# Patient Record
Sex: Male | Born: 1980 | Race: White | Hispanic: No | Marital: Married | Smoking: Never smoker
Health system: Southern US, Community
[De-identification: ages and names within clinical notes are randomized; demographics above are authoritative.]

## PROBLEM LIST (undated history)

## (undated) DIAGNOSIS — E7800 Pure hypercholesterolemia, unspecified: Secondary | ICD-10-CM

## (undated) DIAGNOSIS — M26629 Arthralgia of temporomandibular joint, unspecified side: Secondary | ICD-10-CM

## (undated) DIAGNOSIS — J9819 Other pulmonary collapse: Secondary | ICD-10-CM

## (undated) DIAGNOSIS — R030 Elevated blood-pressure reading, without diagnosis of hypertension: Secondary | ICD-10-CM

## (undated) DIAGNOSIS — T781XXA Other adverse food reactions, not elsewhere classified, initial encounter: Secondary | ICD-10-CM

## (undated) DIAGNOSIS — J4540 Moderate persistent asthma, uncomplicated: Secondary | ICD-10-CM

## (undated) DIAGNOSIS — F419 Anxiety disorder, unspecified: Secondary | ICD-10-CM

## (undated) DIAGNOSIS — G4700 Insomnia, unspecified: Secondary | ICD-10-CM

## (undated) DIAGNOSIS — R7611 Nonspecific reaction to tuberculin skin test without active tuberculosis: Secondary | ICD-10-CM

## (undated) DIAGNOSIS — J341 Cyst and mucocele of nose and nasal sinus: Secondary | ICD-10-CM

## (undated) HISTORY — DX: Pure hypercholesterolemia, unspecified: E78.00

## (undated) HISTORY — DX: Elevated blood-pressure reading, without diagnosis of hypertension: R03.0

## (undated) HISTORY — DX: Moderate persistent asthma, uncomplicated: J45.40

## (undated) HISTORY — DX: Insomnia, unspecified: G47.00

## (undated) HISTORY — DX: Cyst and mucocele of nose and nasal sinus: J34.1

## (undated) HISTORY — DX: Other adverse food reactions, not elsewhere classified, initial encounter: T78.1XXA

## (undated) HISTORY — PX: OTHER SURGICAL HISTORY: SHX169

## (undated) HISTORY — DX: Other pulmonary collapse: J98.19

## (undated) HISTORY — DX: Anxiety disorder, unspecified: F41.9

## (undated) HISTORY — DX: Nonspecific reaction to tuberculin skin test without active tuberculosis: R76.11

## (undated) HISTORY — DX: Arthralgia of temporomandibular joint, unspecified side: M26.629

---

## 2010-10-21 ENCOUNTER — Ambulatory Visit (INDEPENDENT_AMBULATORY_CARE_PROVIDER_SITE_OTHER): Payer: BC Managed Care – PPO | Admitting: Family Medicine

## 2010-10-21 ENCOUNTER — Encounter: Payer: Self-pay | Admitting: Family Medicine

## 2010-10-21 DIAGNOSIS — F411 Generalized anxiety disorder: Secondary | ICD-10-CM

## 2010-10-21 DIAGNOSIS — J309 Allergic rhinitis, unspecified: Secondary | ICD-10-CM | POA: Insufficient documentation

## 2010-10-21 DIAGNOSIS — G4700 Insomnia, unspecified: Secondary | ICD-10-CM

## 2010-10-21 DIAGNOSIS — Z23 Encounter for immunization: Secondary | ICD-10-CM

## 2010-10-21 DIAGNOSIS — IMO0002 Reserved for concepts with insufficient information to code with codable children: Secondary | ICD-10-CM | POA: Insufficient documentation

## 2010-10-21 DIAGNOSIS — F419 Anxiety disorder, unspecified: Secondary | ICD-10-CM | POA: Insufficient documentation

## 2010-10-21 DIAGNOSIS — J45909 Unspecified asthma, uncomplicated: Secondary | ICD-10-CM

## 2010-10-21 HISTORY — DX: Insomnia, unspecified: G47.00

## 2010-10-21 MED ORDER — ALBUTEROL SULFATE HFA 108 (90 BASE) MCG/ACT IN AERS: 2.0000 | INHALATION_SPRAY | Freq: Four times a day (QID) | RESPIRATORY_TRACT | Status: DC | PRN

## 2010-10-21 MED ORDER — BUDESONIDE 180 MCG/ACT IN AEPB: 1.0000 | INHALATION_SPRAY | Freq: Two times a day (BID) | RESPIRATORY_TRACT | Status: DC

## 2010-10-21 MED ORDER — ACYCLOVIR 400 MG PO TABS: ORAL_TABLET | ORAL | Status: DC

## 2010-10-21 MED ORDER — FEXOFENADINE HCL 180 MG PO TABS: 180.0000 mg | ORAL_TABLET | Freq: Every day | ORAL | Status: DC

## 2010-10-21 MED ORDER — CITALOPRAM HYDROBROMIDE 20 MG PO TABS: 20.0000 mg | ORAL_TABLET | Freq: Every day | ORAL | Status: DC

## 2010-10-21 NOTE — Assessment & Plan Note (Signed)
Continue Allegra 180 mg qd 

## 2010-10-21 NOTE — Assessment & Plan Note (Signed)
Poor control.  I do not think he is having an acute flare at this time, but he'll call if he is requiring alb rescue 2-3 times per day in the next week (he has been taking it 3 times per day SCHEDULED for the last 3-4 d at the instruction of his dad (pulmonologist) until he could be seen by a local MD). Will start pulmicort flexhaler 180 mcg, 2 puffs bid.  Continue albut (ProAir) 1-2 puffs q4h PRN.

## 2010-10-21 NOTE — Assessment & Plan Note (Signed)
Start citalopram 20mg  qd in the evening.  Therapeutic expectations and side effect profile of medication discussed today.  Patient's questions answered. F/u 3 wks.

## 2010-10-21 NOTE — Progress Notes (Signed)
Office Note 10/21/2010  CC:  Chief Complaint  Patient presents with  . Asthma    HPI:  Matthew Petty is a 30 y.o. latino male who is here to establish care and discuss asthma, anxiety, and insomnia. Patient's most recent primary MD: Gala Lewandowsky of [facility].  Old records were not reviewed prior to or during today's visit.  1) hx of asthma, no controller therapy in the last few years, had onset of increased nasal allergies/sneezing about 4 mo/ago, also wheezing/sob requiring albuterol 2-3 times per week, more often hs.  The past week he has required it more often: several times per day and it helps.  No fevers, no productive cough, no chest pain, no palpitations, no HA, no ST, no ear pain, no dizziness.  His dad is a pulmonologist in [place] and he told him to get an MD locally and be seen for worsening asthma lately.   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  Has new infant son.  Frustrated with no time to work on what he wants to work on.  Says an MD in [place] tried wellbutrin but no help w/45mo of use. +sleep difficulty, sounds like it has been last few years, worse the last few months.  Tried sleep hygiene but these type of behavioral mod therapies INDUCE more anxiety in him per his report today.  Doesn't nap in daytime.  Goes to sleep same time nightly, up same time in am.  Feels exhausted some days, some days fine.  Ambien trials in the past caused too much of a drugged feeling/hangover effect.   Past Medical History  Diagnosis Date  . Asthma   . Allergic rhinitis     ragweed  . Herpes simplex     genital, recurrent  . Anxiety     Failed trial of wellbutrin in the past  . 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 Elevated blood pressure reading without diagnosis of hypertension     History reviewed. No  pertinent past surgical history.  Family History  Problem Relation Age of Onset  . Cancer Mother     Thyroid: cured (age 40)  . Cancer Paternal Grandfather     Prostate cancer in his 39s  . Heart disease Maternal Grandfather   . Mental illness Maternal Grandfather     bipolar?    History   Social History  . Marital Status: Married    Spouse Name: N/A    Number of Children: N/A  . Years of Education: N/A   Occupational History  . Not on file.   Social History Main Topics  . Smoking status: Never Smoker   . Smokeless tobacco: Never Used  . Alcohol Use: Yes     weekend  . Drug Use: No  . Sexually Active: Not on file   Other Topics Concern  . Not on file   Social History Narrative   Married, has one 58 month old son.Moved to [place] from [place] around 2006.Relocated to [place] 2010, teaches spanish at [employer] postgrad degree in business/finance.No exercise.No tobacco or drugs.  Drinks some alcohol on weekends/socially.   MEDS: currently taking allegra 180mg  qd, acyclovir 400mg  qd, albuterol HFA 2 puffs q4h prn.  Outpatient Encounter Prescriptions as of 10/21/2010  Medication Sig Dispense Refill  . acyclovir (ZOVIRAX) 400 MG tablet Take 1 tablet  by mouth twice daily, may increase to three times daily x 5 days for outbreak  75 tablet  5  . albuterol (PROAIR HFA) 108 (90 BASE) MCG/ACT inhaler Inhale 2 puffs into the lungs every 6 (six) hours as needed for wheezing.  1 Inhaler  1  . budesonide (PULMICORT FLEXHALER) 180 MCG/ACT inhaler Inhale 1 puff into the lungs 2 (two) times daily.  1 Inhaler  0  . citalopram (CELEXA) 20 MG tablet Take 1 tablet (20 mg total) by mouth daily.  30 tablet  0  . fexofenadine (ALLEGRA) 180 MG tablet Take 1 tablet (180 mg total) by mouth daily.  30 tablet  11  . DISCONTD: acyclovir (ZOVIRAX) 400 MG tablet Take 1 tablet by mouth twice daily, may increase to three times daily x 5 days for outbreak       . DISCONTD: albuterol (PROVENTIL  HFA) 108 (90 BASE) MCG/ACT inhaler Inhale 2 puffs into the lungs every 6 (six) hours as needed.        Marland Kitchen DISCONTD: albuterol (PROVENTIL HFA;VENTOLIN HFA) 108 (90 BASE) MCG/ACT inhaler Inhale 2 puffs into the lungs every 6 (six) hours as needed.         No Known Allergies  ROS Review of Systems  Constitutional: Negative for fever, chills, appetite change and fatigue (mild,intermittent).  HENT: Positive for sneezing. Negative for ear pain, congestion, sore throat, rhinorrhea, neck stiffness and dental problem.   Eyes: Negative for discharge, redness and visual disturbance.  Respiratory: Positive for chest tightness, shortness of breath and wheezing. Negative for cough.   Cardiovascular: Negative for chest pain, palpitations and leg swelling.  Gastrointestinal: Negative for nausea, vomiting, abdominal pain, diarrhea and blood in stool.  Genitourinary: Negative for dysuria, urgency, frequency, hematuria, flank pain and difficulty urinating.  Musculoskeletal: Negative for myalgias, back pain, joint swelling and arthralgias.  Skin: Negative for pallor and rash.  Neurological: Negative for dizziness, speech difficulty, weakness and headaches.  Hematological: Negative for adenopathy. Does not bruise/bleed easily.  Psychiatric/Behavioral: Negative for confusion, sleep disturbance and dysphoric mood. The patient is nervous/anxious.     PE; Blood pressure 147/82, pulse 65, temperature 98.1 F (36.7 C), temperature source Oral, weight 179 lb (81.194 kg), SpO2 99.00%. Gen: Alert, well appearing.  Patient is oriented to person, place, time, and situation. HEENT: Scalp without lesions or hair loss.  Ears: EACs clear, normal epithelium.  TMs with good light reflex and landmarks bilaterally.  Eyes: no injection, icteris, swelling, or exudate.  EOMI, PERRLA. Nose: no drainage or turbinate edema/swelling.  No injection or focal lesion.  Mouth: lips without lesion/swelling.  Oral mucosa pink and moist.   Dentition intact and without obvious caries or gingival swelling.  Oropharynx without erythema, exudate, or swelling.  Neck: supple, ROM full. No lymphadenopathy, thyromegaly, or mass. Chest: symmetric expansion, nonlabored respirations.  Clear and equal breath sounds in all lung fields.   CV: RRR, no m/r/g.  Peripheral pulses 2+ and symmetric. ABD: soft, NT, ND, BS normal.  No hepatospenomegaly or mass.  No bruits. EXT: no clubbing, cyanosis, or edema.   Pertinent labs:  none  ASSESSMENT AND PLAN:   Asthma Poor control.  I do not think he is having an acute flare at this time, but he'll call if he is requiring alb rescue 2-3 times per day in the next week (he has been taking it 3 times per day SCHEDULED for the last 3-4 d at the instruction of his dad (pulmonologist) until he could be seen by  a local MD). Will start pulmicort flexhaler 180 mcg, 2 puffs bid.  Continue albut (ProAir) 1-2 puffs q4h PRN.  Allergic rhinitis Continue Allegra 180mg  qd.  Anxiety Start citalopram 20mg  qd in the evening.  Therapeutic expectations and side effect profile of medication discussed today.  Patient's questions answered. F/u 3 wks.  Insomnia Continue sleep hygiene adjustments the best he can. Hopefully citalopram in evening will help for this insomnia in the short term. If not, he'll call and ask for additional sleep aid (I will start either low dose benzo or low dose trazodone).   Flu vaccine IM given today.  RF'd acyclovir today.  Return in about 3 weeks (around 11/11/2010) for f/u anxiety, asthma, insomnia.  Needs lab visit for fasting blood work at his convenience ?next tues.

## 2010-10-21 NOTE — Assessment & Plan Note (Signed)
Continue sleep hygiene adjustments the best he can. Hopefully citalopram in evening will help for this insomnia in the short term. If not, he'll call and ask for additional sleep aid (I will start either low dose benzo or low dose trazodone).

## 2010-10-27 ENCOUNTER — Other Ambulatory Visit (INDEPENDENT_AMBULATORY_CARE_PROVIDER_SITE_OTHER): Payer: BC Managed Care – PPO

## 2010-10-27 ENCOUNTER — Other Ambulatory Visit: Payer: Self-pay | Admitting: Family Medicine

## 2010-10-27 ENCOUNTER — Encounter: Payer: Self-pay | Admitting: Family Medicine

## 2010-10-27 DIAGNOSIS — F411 Generalized anxiety disorder: Secondary | ICD-10-CM

## 2010-10-27 DIAGNOSIS — E663 Overweight: Secondary | ICD-10-CM

## 2010-10-27 DIAGNOSIS — J45909 Unspecified asthma, uncomplicated: Secondary | ICD-10-CM

## 2010-10-27 DIAGNOSIS — R030 Elevated blood-pressure reading, without diagnosis of hypertension: Secondary | ICD-10-CM | POA: Insufficient documentation

## 2010-10-27 LAB — COMPREHENSIVE METABOLIC PANEL
AST: 17 U/L (ref 0–37)
Alkaline Phosphatase: 48 U/L (ref 39–117)
BUN: 14 mg/dL (ref 6–23)
Creatinine, Ser: 1.1 mg/dL (ref 0.4–1.5)
Glucose, Bld: 86 mg/dL (ref 70–99)
Total Bilirubin: 1 mg/dL (ref 0.3–1.2)

## 2010-10-27 LAB — CBC WITH DIFFERENTIAL/PLATELET
Basophils Absolute: 0 10*3/uL (ref 0.0–0.1)
Eosinophils Relative: 6.5 % — ABNORMAL HIGH (ref 0.0–5.0)
HCT: 47.8 % (ref 39.0–52.0)
Hemoglobin: 16.1 g/dL (ref 13.0–17.0)
Lymphocytes Relative: 34.3 % (ref 12.0–46.0)
Lymphs Abs: 2.5 10*3/uL (ref 0.7–4.0)
Monocytes Relative: 7 % (ref 3.0–12.0)
Platelets: 207 10*3/uL (ref 150.0–400.0)
WBC: 7.2 10*3/uL (ref 4.5–10.5)

## 2010-10-27 LAB — LIPID PANEL
Cholesterol: 180 mg/dL (ref 0–200)
HDL: 44.9 mg/dL (ref >39.00)
LDL Cholesterol: 115 mg/dL — ABNORMAL HIGH (ref 0–99)
VLDL: 20.4 mg/dL (ref 0.0–40.0)

## 2010-10-27 LAB — TSH: TSH: 1.53 u[IU]/mL (ref 0.35–5.50)

## 2010-10-27 MED ORDER — TRAZODONE HCL 50 MG PO TABS: ORAL_TABLET | ORAL | Status: AC

## 2010-10-28 NOTE — Progress Notes (Signed)
Quick Note:  Please notify: all labs came back normal. --PM ______

## 2010-10-30 ENCOUNTER — Encounter: Payer: Self-pay | Admitting: *Deleted

## 2010-11-09 ENCOUNTER — Institutional Professional Consult (permissible substitution): Payer: Self-pay | Admitting: Internal Medicine

## 2010-11-11 ENCOUNTER — Ambulatory Visit (INDEPENDENT_AMBULATORY_CARE_PROVIDER_SITE_OTHER): Payer: BC Managed Care – PPO | Admitting: Family Medicine

## 2010-11-11 ENCOUNTER — Encounter: Payer: Self-pay | Admitting: Family Medicine

## 2010-11-11 DIAGNOSIS — J45909 Unspecified asthma, uncomplicated: Secondary | ICD-10-CM

## 2010-11-11 DIAGNOSIS — G4700 Insomnia, unspecified: Secondary | ICD-10-CM

## 2010-11-11 DIAGNOSIS — F411 Generalized anxiety disorder: Secondary | ICD-10-CM

## 2010-11-11 DIAGNOSIS — F419 Anxiety disorder, unspecified: Secondary | ICD-10-CM

## 2010-11-11 DIAGNOSIS — J309 Allergic rhinitis, unspecified: Secondary | ICD-10-CM

## 2010-11-11 MED ORDER — FLUTICASONE PROPIONATE 50 MCG/ACT NA SUSP: 2.0000 | Freq: Every day | NASAL | Status: DC

## 2010-11-11 MED ORDER — CITALOPRAM HYDROBROMIDE 20 MG PO TABS: 20.0000 mg | ORAL_TABLET | Freq: Every day | ORAL | Status: DC

## 2010-11-11 MED ORDER — FLUTICASONE-SALMETEROL 250-50 MCG/DOSE IN AEPB: 1.0000 | INHALATION_SPRAY | Freq: Two times a day (BID) | RESPIRATORY_TRACT | Status: DC

## 2010-11-11 NOTE — Patient Instructions (Signed)
Indoor Allergies House dust often contains a mixture of tiny particles that commonly cause allergic symptoms. These include dust mites, cockroaches, fungi spores (mold) and animal dander.  DUST MITES Dust mites are so tiny that they cannot be seen with the naked eye (microscopic). They are relatives of the spider. They live on mattresses, pillows, bedding, upholstered furniture, carpets and curtains. These tiny creatures feed on skin flakes that people and pets shed daily. They commonly float around in the dust in your home when vacuuming or when bedding is disturbed. The air-born dust mites often cause runny noses and symptoms of asthma. The problems are similar to a pollen allergy. These mites thrive in summer and die in winter. In a warm, humid house, however, they continue to thrive even in the coldest months. The particles seen floating in a shaft of sunlight include dead dust mites and their waste-products. These waste-products, which are proteins, cause the allergic reaction. Even in the cleanest home, dust mites still exist. This is because typical cleaning methods cannot eliminate many of the dust particles.  COCKROACHES Cockroach allergy is primarily caused by their droppings. It is found in house dust, especially in older homes. MOLD Mold is very often found in homes and house dust, and when in high concentrations may become harmful, especially for people allergic to mold. They tend to grow faster in the presence of moisture. ANIMAL DANDER Pets (furred animals) can cause allergies too. This is not caused by the fur, but from the proteins in their skin, saliva and urine. These proteins are called allergens. The dander (skin scales) is the source of most pet allergies. Therefore, short-haired animals can cause allergies as much as Soil scientist. Dander and saliva are the source of cat and dog allergens. Urine is the source of allergens from rabbits, hamsters, mice and [place]  pigs. PREVENTIVE STRATEGIES DUST MITES  Use a dehumidifier or air conditioner to keep the humidity low (50% or below).   Cover your mattress and pillows in dust-proof or allergen resistant covers.   Wash all bedding and blankets once a week in hot water (at least 130 - 140F). Non-washable bedding can be frozen overnight to kill dust mites.   Replace wool or feathered bedding with synthetic materials and traditional stuffed animals with washable ones.   If possible, replace wall-to-wall carpets in bedrooms with bare floors (linoleum, tile or wood). Remove fabric curtains and upholstered furniture.   Use a damp mop or rag to remove dust. Do not use a dry cloth since this stirs up mite allergens.   Use a vacuum cleaner with either a double-layered micro filter bag or a HEPA filter. These filters trap allergens that pass through a vacuum's exhaust.   Wear a mask while vacuuming to avoid inhaling allergens. Stay out of the vacuumed area for 20 minutes while dust and allergens settle.   Use only high efficiency media filters for your furnace and air-conditioning, preferably with a MERV rating of 11 or 12. In order to maintain a clean filter, remember to change it at least every three months.  COCKROACHES Control cockroaches by eliminating their entrance to the home and by eliminating their food sources.   Block crevices and cracks and remove water sources such as leaky faucets and pipes.   Keep food out of the open when finished eating. This also includes pet food. Sealed containers for food work well. Remove crumbs that may have accumulated such as in a toaster.   Use garbage containers that  have lids and immediately clean off counters, tables and stove tops.   An exterminator might be helpful as well.  MOLD Control mold by eliminating moisture and dampness.   Repair leaks around the home including the roof and pipes.   For high humid areas consider using dehumidifiers. Rooms with the  most moisture include kitchens, bathrooms and basements.   Ventilation and cleaning are also important.   Detergent or 5% bleach can be used to clean off mold from hard surfaces. It is important not to mix bleach with other products and to dry the area completely after cleaning.   For more extensive mold problems hire an Gaffer.   For mold on clothing, soap and water work best. If they cannot be cleaned throw them out.  ANIMAL DANDER  Control pet dander by removing pets from your home. If this is not an option then try lessen the contact by keeping the pet out of areas that you spend most of your time, such as the bedroom.   Vacuum often and consider replacing carpet with a hardwood floor, tile or linoleum.   A HEPA air cleaner may also help to reduce the level of animal allergen in the air.  Document Released: 12/20/2003 Document Re-Released: 07/08/2009 [facility] Patient Information 2011 [place], [place].

## 2010-11-11 NOTE — Assessment & Plan Note (Signed)
Poor control.  Use allegra daily instead of prn. Add flonase qd. Gave pt handout on indoor allergen control and discussed it with him.

## 2010-11-11 NOTE — Assessment & Plan Note (Signed)
Control improved some but still needs step-up in therapy. Change to advair 250/50, 1 puff bid.   Recheck 6 wks.

## 2010-11-11 NOTE — Progress Notes (Signed)
OFFICE NOTE  11/11/2010  CC:  Chief Complaint  Patient presents with  . Asthma  . Anxiety  . Insomnia     HPI:   Patient is a 30 y.o. latino male who is here for 3 wk f/u asthma, GAD, and insomnia. Asthma: improving but still using albuterol about once per day avg.  Sounds like he has some indoor allergen problems at night, plus some exercise induced sx's in daytime.  Takes allegra infrequently b/c it causes mild sedation/tired feeling.  No nasal spray currently.  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    Insomnia: much better on 50mg  trazodone, although having mild drowsiness in AM on most but not all mornings.  Pertinent PMH:  Past Medical History  Diagnosis Date  . Asthma   . Allergic rhinitis     ragweed  . Herpes simplex     genital, recurrent  . Anxiety     Failed trial of wellbutrin in the past  . 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 Elevated blood pressure reading without diagnosis of hypertension     MEDS;   Outpatient Prescriptions Prior to Visit  Medication Sig Dispense Refill  . acyclovir (ZOVIRAX) 400 MG tablet Take 1 tablet by mouth twice daily, may increase to three times daily x 5 days for outbreak  75 tablet  5  . albuterol (PROAIR HFA) 108 (90 BASE) MCG/ACT inhaler Inhale 2 puffs into the lungs every 6 (six) hours as needed for wheezing.  1 Inhaler  1  . fexofenadine (ALLEGRA) 180 MG tablet Take 1 tablet (180 mg total) by mouth daily.  30 tablet  11  . traZODone (DESYREL) 50 MG tablet 1-2 tabs po qhs as needed for insomnia  30 tablet  1  . budesonide (PULMICORT FLEXHALER) 180 MCG/ACT inhaler Inhale 1 puff into the lungs 2 (two) times daily.  1 Inhaler  0  . citalopram (CELEXA) 20 MG  tablet Take 1 tablet (20 mg total) by mouth daily.  30 tablet  0    PE: Blood pressure 120/80, pulse 66, temperature 98.5 F (36.9 C), temperature source Oral, weight 178 lb (80.74 kg), SpO2 94.00%. Gen: Alert, well appearing.  Patient is oriented to person, place, time, and situation. HEENT: Scalp without lesions or hair loss.  Ears: EACs clear, normal epithelium.  TMs with good light reflex and landmarks bilaterally.  Eyes: no injection, icteris, swelling, or exudate.  EOMI, PERRLA. Nose: no drainage or turbinate edema/swelling.  No injection or focal lesion.  Mouth: lips without lesion/swelling.  Oral mucosa pink and moist.  Dentition intact and without obvious caries or gingival swelling.  Oropharynx without erythema, exudate, or swelling.  Neck: supple, ROM full.  No lymphadenopathy, thyromegaly, or mass. Chest: symmetric expansion, nonlabored respirations.  Clear and equal breath sounds in all lung fields.   CV: RRR, no m/r/g.  Peripheral pulses 2+ and symmetric. EXT: no clubbing, cyanosis, or edema.    IMPRESSION AND PLAN:  Asthma Control improved some but still needs step-up in therapy. Change to advair 250/50, 1 puff bid.   Recheck 6 wks.  Allergic rhinitis Poor control.  Use allegra daily instead of prn. Add flonase qd. Gave pt handout on indoor allergen control  and discussed it with him.  Anxiety Improving. We'll give his mild cognitive slowing a bit more time to see if this resolves. Cont. 20mg  qd dosing but change this to morning dosing instead of bedtime. Recheck in 6 wks.  Insomnia Responds well to one 50mg  trazodone. We'll continue this and see if his tendency towards some morning drowsiness improves spontaneously over time. We'll also switch his citalopram dosing to qAM instead of qhs.    FOLLOW UP:  Return in about 6 weeks (around 12/23/2010) for f/u asthma, anxiety, insomnia.

## 2010-11-11 NOTE — Assessment & Plan Note (Signed)
Responds well to one 50mg  trazodone. We'll continue this and see if his tendency towards some morning drowsiness improves spontaneously over time. We'll also switch his citalopram dosing to qAM instead of qhs.

## 2010-11-11 NOTE — Assessment & Plan Note (Signed)
Improving. We'll give his mild cognitive slowing a bit more time to see if this resolves. Cont. 20mg  qd dosing but change this to morning dosing instead of bedtime. Recheck in 6 wks.

## 2010-11-26 ENCOUNTER — Telehealth: Payer: Self-pay | Admitting: Family Medicine

## 2010-11-26 NOTE — Telephone Encounter (Signed)
Patient is feeling very sleepy & is having muscle cramps and headaches since he started taking the advair and flonase with his other meds. Please advise

## 2010-11-27 NOTE — Telephone Encounter (Signed)
Pt is having residual drowsiness with Trazodone, Advair and Flonase.  Pt states his allergies are doing better.  He has tried eliminating meds to see what may help.  He believes at this point if he had a different sleep med that may have less residual drowsiness he would be OK on Advair and Flonase.  Please advise.  [facility].

## 2010-11-29 ENCOUNTER — Other Ambulatory Visit: Payer: Self-pay | Admitting: Family Medicine

## 2010-11-29 MED ORDER — CLONIDINE HCL 0.1 MG PO TABS: ORAL_TABLET | ORAL | Status: DC

## 2010-11-29 NOTE — Telephone Encounter (Signed)
OK.  Stop trazodone.  Continue advair and flonase. Will do eRx for clonidine 0.1mg  tabs.  He needs to take one about 30-60 minutes before bedtime every night. He can dose every 3 days to a maximum of 3 tabs at bedtime.  If 3 tabs is not helpful then call back or return to discuss. Go ahead and notify him that the drug info that comes with the med when he gets it at the pharmacy will mention that it is a blood pressure medication.  This is true, but it is not a very good bp med (so he doesn't have to worry about it dropping his bp too low), PLUS it's more common use these days is for insomnia b/c of drowsiness side effect.  Also, he might want to know that it is very inexpensive.  Thx--PM

## 2010-11-30 NOTE — Telephone Encounter (Signed)
I have attempted to contact this patient by phone with the following results: left message to return my call on answering machine (home/mobile).  

## 2010-11-30 NOTE — Telephone Encounter (Signed)
I definitely agree that he should NOT stop his flonase or advair.  He should be using an over the counter saline nasal spray for moisturization of his nose twice daily.

## 2010-11-30 NOTE — Telephone Encounter (Signed)
Advised of below.  Pt is agreeable.  He also states that he has had 2 nosebleeds.  Advised to apply pressure and avoid blowing nose.  Pt had stopped flonase/advair.  I advised pt to restart these.  Please advise any other recommendation.

## 2010-12-02 ENCOUNTER — Encounter: Payer: Self-pay | Admitting: Family Medicine

## 2010-12-02 ENCOUNTER — Ambulatory Visit (INDEPENDENT_AMBULATORY_CARE_PROVIDER_SITE_OTHER): Payer: BC Managed Care – PPO | Admitting: Family Medicine

## 2010-12-02 DIAGNOSIS — R030 Elevated blood-pressure reading, without diagnosis of hypertension: Secondary | ICD-10-CM

## 2010-12-02 DIAGNOSIS — F419 Anxiety disorder, unspecified: Secondary | ICD-10-CM

## 2010-12-02 DIAGNOSIS — J45909 Unspecified asthma, uncomplicated: Secondary | ICD-10-CM

## 2010-12-02 DIAGNOSIS — G4700 Insomnia, unspecified: Secondary | ICD-10-CM

## 2010-12-02 DIAGNOSIS — J019 Acute sinusitis, unspecified: Secondary | ICD-10-CM | POA: Insufficient documentation

## 2010-12-02 DIAGNOSIS — F411 Generalized anxiety disorder: Secondary | ICD-10-CM

## 2010-12-02 MED ORDER — AMOXICILLIN-POT CLAVULANATE 875-125 MG PO TABS: 1.0000 | ORAL_TABLET | Freq: Two times a day (BID) | ORAL | Status: AC

## 2010-12-02 MED ORDER — AZELASTINE HCL 0.15 % NA SOLN: NASAL | Status: DC

## 2010-12-02 NOTE — Assessment & Plan Note (Signed)
Augmentin 875mg  bid x 10d. Astepro 2 sprays q12h prn. Saline nasal spray tid prn. Avoid OTC cold meds with phenylephrine or sudafed.

## 2010-12-02 NOTE — Progress Notes (Signed)
OFFICE NOTE  12/02/2010  CC:  Chief Complaint  Patient presents with  . multiple issues    elevated blood pressure, nosebleeds-did not restart advair or flonase     HPI:   Patient is a 30 y.o. [place] male who is here for insomnia, headaches/head fullness. Says trazodone helped initiate sleep but he woke up with hangover effect (fatigue, HA) and then sleepiness lingered into his day. Stopped trazodone.   Has had about 5-7 days of nasal congestion , head feeling very full, retro-orbital mild headache, some PND.  Was on flonase but stopped it b/c he had 4 nosebleeds that apparently were hard to stop, all in right nostril.  Was on some ibuprofen around that time as well.  He bought a bp cuff thinking maybe his bp was up and it was causing his HA and nose bleeds, says several measurements were in the range of 130-140s systolic, 80-100 diastolic.   No fevers, no cough, no ST.   Still having some mild memory problems and foggy thinking since being on citalopram.  He also thinks his sleep has been worse since being on this med.  It has helped his anxiety pretty well, however. Says he hasn't had wheezing lately.  He even stopped his advair when he started getting nose bleeds.  Pertinent PMH:  Asthma, mild persistent Allergic rhinitis Insomnia GAD Elevated bp w/out hx of HTN  MEDS;   Outpatient Prescriptions Prior to Visit  Medication Sig Dispense Refill  . acyclovir (ZOVIRAX) 400 MG tablet Take 1 tablet by mouth twice daily, may increase to three times daily x 5 days for outbreak  75 tablet  5  . albuterol (PROAIR HFA) 108 (90 BASE) MCG/ACT inhaler Inhale 2 puffs into the lungs every 6 (six) hours as needed for wheezing.  1 Inhaler  1  . cloNIDine (CATAPRES) 0.1 MG tablet 1-3 tabs po qhs for insomnia  30 tablet  1  . fexofenadine (ALLEGRA) 180 MG tablet Take 1 tablet (180 mg total) by mouth daily.  30 tablet  11  . citalopram (CELEXA) 20 MG tablet Take 1 tablet (20 mg total) by  mouth daily.  30 tablet  5  . Fluticasone-Salmeterol (ADVAIR DISKUS) 250-50 MCG/DOSE AEPB Inhale 1 puff into the lungs 2 (two) times daily.  60 each  2  . fluticasone (FLONASE) 50 MCG/ACT nasal spray Place 2 sprays into the nose daily.  16 g  12    PE: Blood pressure 140/90, pulse 91, temperature 98.4 F (36.9 C), temperature source Oral, weight 178 lb (80.74 kg). VS: noted--normal. Gen: alert, NAD, NONTOXIC APPEARING. HEENT: eyes without injection, drainage, or swelling.  Ears: EACs clear, TMs with normal light reflex and landmarks.  Nose: no blood, no polyp.  Right side shows significant diffuse injection and edema of mucosa.  No purulent d/c.  Mild right paranasal sinus TTP.  No facial swelling.  Throat and mouth without focal lesion.  No pharyngial swelling, erythema, or exudate.   Neck: supple, no LAD.   LUNGS: CTA bilat, nonlabored resps.   CV: RRR, no m/r/g. EXT: no c/c/e SKIN: no rash    IMPRESSION AND PLAN:  Sinusitis acute Augmentin 875mg  bid x 10d. Astepro 2 sprays q12h prn. Saline nasal spray tid prn. Avoid OTC cold meds with phenylephrine or sudafed.  Insomnia Multifactorial: anxiety, possibly med side effect from citalopram, recent sinusitis. Clonidine rx'd and he has picked this up but not tried it yet.  Take 0.1-0.3 mg qhs.  Therapeutic expectations and  side effect profile of medication discussed today.  Patient's questions answered.   Anxiety Moderately improved on citalopram but it seems he has mild cognitive slowing as well as worsened chronic insomnia on this med. Ween off this med over the next 2 wks.  We'll discuss trial of a new med for anxiety at f/u in a few weeks.  Elevated blood pressure reading without diagnosis of hypertension Likely has HTN, but I would like him to do some more home monitoring of bp since he bought a cuff. Parameters given.  He'll record bp and pulse at least once daily until next f/u in a couple of weeks. He'll call before then if  bp exceeds the parameters I wrote out for him. Will do EKG if bps remain up and we end up starting antihypertensive med. Of note, routine labs in the last couple of months were normal.  Asthma Quiescent since ragweed season has calmed down.  He is off controller med now for over a week and not requiring any rescue med. He'll restart advair if sx's begin to become active again.    FOLLOW UP:  Return in about 2 weeks (around 12/16/2010) for f/u insomnia, sinusitis, elevated bp.

## 2010-12-02 NOTE — Assessment & Plan Note (Signed)
Likely has HTN, but I would like him to do some more home monitoring of bp since he bought a cuff. Parameters given.  He'll record bp and pulse at least once daily until next f/u in a couple of weeks. He'll call before then if bp exceeds the parameters I wrote out for him. Will do EKG if bps remain up and we end up starting antihypertensive med. Of note, routine labs in the last couple of months were normal.

## 2010-12-02 NOTE — Telephone Encounter (Signed)
Pt has appt 12/02/10.

## 2010-12-02 NOTE — Assessment & Plan Note (Signed)
Multifactorial: anxiety, possibly med side effect from citalopram, recent sinusitis. Clonidine rx'd and he has picked this up but not tried it yet.  Take 0.1-0.3 mg qhs.  Therapeutic expectations and side effect profile of medication discussed today.  Patient's questions answered.

## 2010-12-02 NOTE — Assessment & Plan Note (Signed)
Quiescent since ragweed season has calmed down.  He is off controller med now for over a week and not requiring any rescue med. He'll restart advair if sx's begin to become active again.

## 2010-12-02 NOTE — Assessment & Plan Note (Signed)
Moderately improved on citalopram but it seems he has mild cognitive slowing as well as worsened chronic insomnia on this med. Ween off this med over the next 2 wks.  We'll discuss trial of a new med for anxiety at f/u in a few weeks.

## 2010-12-02 NOTE — Patient Instructions (Signed)
1)Take 1/2 of 20mg  citalopram once daily for 7d, then take 1/2 tab every other day x 4 doses, then stop. 2) Start clonidine at bedtime for sleep. 3) Take meds I rx for sinusitis 4) Stay off flonase but keep advair at home and restart if asthma begins to act up again. 5) Check bp at least once daily with heart rate measurement and write them down and bring to next visit. Goal bp is <140 on top and <90 on bottom.  If you get a blood pressure >165 on top or > 110 on bottom then call the office or come in. If consistently between 140 and 160 on top or between 90-110 on bottom, then call after 1 wk.

## 2010-12-21 ENCOUNTER — Ambulatory Visit (INDEPENDENT_AMBULATORY_CARE_PROVIDER_SITE_OTHER): Payer: BC Managed Care – PPO | Admitting: Family Medicine

## 2010-12-21 ENCOUNTER — Encounter: Payer: Self-pay | Admitting: Family Medicine

## 2010-12-21 DIAGNOSIS — F419 Anxiety disorder, unspecified: Secondary | ICD-10-CM

## 2010-12-21 DIAGNOSIS — J309 Allergic rhinitis, unspecified: Secondary | ICD-10-CM

## 2010-12-21 DIAGNOSIS — F411 Generalized anxiety disorder: Secondary | ICD-10-CM

## 2010-12-21 DIAGNOSIS — G4700 Insomnia, unspecified: Secondary | ICD-10-CM

## 2010-12-21 DIAGNOSIS — J45909 Unspecified asthma, uncomplicated: Secondary | ICD-10-CM

## 2010-12-21 DIAGNOSIS — R030 Elevated blood-pressure reading, without diagnosis of hypertension: Secondary | ICD-10-CM

## 2010-12-21 MED ORDER — NASONEX 50 MCG/ACT NA SUSP: NASAL | Status: DC

## 2010-12-21 MED ORDER — CLONIDINE HCL 0.1 MG PO TABS: ORAL_TABLET | ORAL | Status: DC

## 2010-12-21 NOTE — Assessment & Plan Note (Signed)
Stable OFF of controller meds currently.

## 2010-12-21 NOTE — Assessment & Plan Note (Signed)
Stable off of meds. No new meds for now.

## 2010-12-21 NOTE — Assessment & Plan Note (Signed)
Resolved. May monitor monthly. Call if persistently > 140/90.

## 2010-12-21 NOTE — Assessment & Plan Note (Signed)
I think his sinusitis component has resolved. Encouraged daily use of allegra 180mg . Start nasonex trial, 2 sprays each nostril (sample and copay savings card given). Continue saline nasal rinse daily. Humidifier for bedroom discussed.

## 2010-12-21 NOTE — Assessment & Plan Note (Signed)
Improved on clonidine 0.2mg  qhs. Continue this, may increase to 0.3mg  qhs if needed. New rx given today.  Therapeutic expectations and side effect profile of medication discussed today.  Patient's questions answered. Advised pt not to abruptly stop this medication.  If needs to get off of it in future will ween off of it.

## 2010-12-21 NOTE — Progress Notes (Signed)
OFFICE NOTE  12/21/2010  CC:  Chief Complaint  Patient presents with  . Follow-up    insomnia, asthma, anxiety     HPI:   Patient is a 30 y.o. [place] male who is here for 3 week f/u allergic rhinitis/sinusitis, insomnia, and anxiety. Anxiety and insomnia: weened off of citalopram w/out problem.  Says thinking/cognition is clearer and back to pre-citalopram state. Actually says anxiety is doing fine/minimal lately, thinks it has a lot to do with getting better sleep on clonidine. He was spending a lot of time worrying about not being able to rest at night, etc, and this issue seems MUCH better since he's getting rest on clonidine (takes 2 of the 0.1mg  tabs most nights.  Missed a couple of nights in a row and had some myoclonic like symptoms--involuntary, brief, truncal jerk-like motion x 1, nothing repetitive.  This happed 2-3 times in the span of a day).  Rhinitis/sinusitis: finished a course of augmentin.  No more HA's but still having some bothersome nasal congest and PND.  No wheezing, no SOB, no ST. Not using allegra much.  Astepro not filled due to cost (150$).   In recent past we had him on generic flonase but he had some nose bleeds and ?worsening sinus sx's on this med.  Had some elevated bp's around the time of last visit:  he took bp at home daily for a while after last visit and all were <140/90.   Pertinent PMH:  Past Medical History  Diagnosis Date  . Asthma   . Allergic rhinitis     ragweed  . Herpes simplex     genital, recurrent  . Anxiety     Failed trial of wellbutrin in the past  . 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 Elevated blood pressure reading without diagnosis of hypertension     MEDS;   Outpatient Prescriptions Prior to Visit  Medication Sig Dispense Refill  . acyclovir (ZOVIRAX) 400 MG tablet Take 1 tablet by mouth twice daily, may increase to three times daily x 5 days for outbreak  75 tablet  5  . albuterol (PROAIR HFA)  108 (90 BASE) MCG/ACT inhaler Inhale 2 puffs into the lungs every 6 (six) hours as needed for wheezing.  1 Inhaler  1  . fexofenadine (ALLEGRA) 180 MG tablet Take 1 tablet (180 mg total) by mouth daily.  30 tablet  11  . cloNIDine (CATAPRES) 0.1 MG tablet 1-3 tabs po qhs for insomnia  30 tablet  1  . Fluticasone-Salmeterol (ADVAIR DISKUS) 250-50 MCG/DOSE AEPB Inhale 1 puff into the lungs 2 (two) times daily.  60 each  2  . Azelastine HCl 0.15 % SOLN 2 sprays each nostril once daily  30 mL  6    PE: Blood pressure 115/77, pulse 77, weight 179 lb (81.194 kg). Gen: Alert, well appearing.  Patient is oriented to person, place, time, and situation. ENT: Ears: EACs clear, normal epithelium.  TMs with good light reflex and landmarks bilaterally.  Eyes: no injection, icteris, swelling, or exudate.  EOMI, PERRLA. Nose: Mild diffuse injection, with right sided > left sided turbinate edema and light yellow mucous.   No focal lesion, no blood.  No paranasal sinus TTP.  Mouth: lips without lesion/swelling.  Oral mucosa pink and moist.  Dentition intact and without obvious caries or gingival swelling.  Oropharynx without erythema, exudate, or swelling.  CV: RRR, no m/r/g.   LUNGS: CTA bilat, nonlabored  resps, good aeration in all lung fields.   IMPRESSION AND PLAN:  Allergic rhinitis I think his sinusitis component has resolved. Encouraged daily use of allegra 180mg . Start nasonex trial, 2 sprays each nostril (sample and copay savings card given). Continue saline nasal rinse daily. Humidifier for bedroom discussed.  Insomnia Improved on clonidine 0.2mg  qhs. Continue this, may increase to 0.3mg  qhs if needed. New rx given today.  Therapeutic expectations and side effect profile of medication discussed today.  Patient's questions answered. Advised pt not to abruptly stop this medication.  If needs to get off of it in future will ween off of it.  Elevated blood pressure reading without diagnosis of  hypertension Resolved. May monitor monthly. Call if persistently > 140/90.  Anxiety Stable off of meds. No new meds for now.  Asthma Stable OFF of controller meds currently.      FOLLOW UP:  Return in about 2 months (around 02/20/2011) for f/u allerg rhinitis and insomnia and asthma.

## 2011-01-11 ENCOUNTER — Ambulatory Visit (INDEPENDENT_AMBULATORY_CARE_PROVIDER_SITE_OTHER): Payer: BC Managed Care – PPO | Admitting: Family Medicine

## 2011-01-11 ENCOUNTER — Encounter: Payer: Self-pay | Admitting: Family Medicine

## 2011-01-11 ENCOUNTER — Ambulatory Visit: Payer: BC Managed Care – PPO | Admitting: Family Medicine

## 2011-01-11 VITALS — BP 120/86 | HR 85 | Temp 98.3°F | Ht 66.5 in | Wt 177.8 lb

## 2011-01-11 DIAGNOSIS — J069 Acute upper respiratory infection, unspecified: Secondary | ICD-10-CM

## 2011-01-11 NOTE — Assessment & Plan Note (Signed)
Sounds like it's resolving. Give it more time. Discussed symptomatic care: continue nasonex, add nasal saline to moisturize and irrigate bid-tid (this should help minimize the nose bleeds). Fluids, rest.   Call in 3-4 d if sx's not improved.

## 2011-01-11 NOTE — Progress Notes (Signed)
OFFICE NOTE  01/11/2011  CC:  Chief Complaint  Patient presents with  . Fatigue    X 7 days  . sinus pressure and nose bleeds    on Tues, Wed, Thurs, Friday and Saturday     HPI:   Patient is a 30 y.o. [place] male who is here for sinus sx's. Pt presents complaining of respiratory symptoms for 6-7  days.  Mostly nasal congestion/runny nose, sneezing, and PND cough.  Worst symptoms seems to be the nasal congestion, right sided sinus pressure, and fatigue.  Lately the symptoms seem to be improving some. No fevers, no wheezing, and no SOB.  No pain in face or teeth.  Mild HA.  No signif ST.  Symptoms made worse by cool air and night time.  Symptoms improved by nasonex that he restarted lately. Smoker? 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 no  ROS: no n/v/d or abdominal pain.  No rash.  No neck stiffness.   +Mild fatigue.  +Mild appetite loss.   Pertinent PMH:   MEDS;   Outpatient Prescriptions Prior to Visit  Medication Sig Dispense Refill  . acyclovir (ZOVIRAX) 400 MG tablet Take 1 tablet by mouth twice daily, may increase to three times daily x 5 days for outbreak  75 tablet  5  . albuterol (PROAIR HFA) 108 (90 BASE) MCG/ACT inhaler Inhale 2 puffs into the lungs every 6 (six) hours as needed for wheezing.  1 Inhaler  1  . cloNIDine (CATAPRES) 0.1 MG tablet 1-3 tabs po qhs for insomnia  70 tablet  2  . fexofenadine (ALLEGRA) 180 MG tablet Take 1 tablet (180 mg total) by mouth daily.  30 tablet  11  . NASONEX 50 MCG/ACT nasal spray 2 sprays each nostril once daily  17 g  10  . Fluticasone-Salmeterol (ADVAIR DISKUS) 250-50 MCG/DOSE AEPB Inhale 1 puff into the lungs 2 (two) times daily.  60 each  2    PE: Blood pressure 120/86, pulse 85, temperature 98.3 F (36.8 C), temperature source Oral, height 5' 6.5" (1.689 m), weight 177 lb 12.8 oz (80.65 kg), SpO2 95.00%. VS: noted--normal. Gen: alert, NAD, NONTOXIC APPEARING. HEENT: eyes without  injection, drainage, or swelling.  Ears: EACs clear, TMs with normal light reflex and landmarks.  Nose: Clar rhinorrhea,e with some dried, crusty exudate adherent to mildly injected mucosa.  Nasal turbinates edematous bilat, R>>L.  No purulent d/c.  No paranasal sinus TTP.  No facial swelling.  Throat and mouth without focal lesion.  No pharyngial swelling, erythema, or exudate.   Neck: supple, no LAD.   LUNGS: CTA bilat, nonlabored resps.   CV: RRR, no m/r/g. EXT: no c/c/e SKIN: no rash    IMPRESSION AND PLAN:  Viral URI Sounds like it's resolving. Give it more time. Discussed symptomatic care: continue nasonex, add nasal saline to moisturize and irrigate bid-tid (this should help minimize the nose bleeds). Fluids, rest.   Call in 3-4 d if sx's not improved.     FOLLOW UP:  Return if symptoms worsen or fail to improve.

## 2011-02-16 ENCOUNTER — Telehealth: Payer: Self-pay | Admitting: *Deleted

## 2011-02-16 NOTE — Telephone Encounter (Signed)
Pt fell on stairs 2 weeks ago and hit back.  Pt began having more back pain after walking a lot this weekend.  Pt wants to know if he needs to come see [place] first or do you recommend visit to ortho. Verbal from Dr. Milinda Cave pt can come to see [place] first. I have attempted to contact this patient by phone with the following results: left message to return my call on answering machine.

## 2011-02-18 NOTE — Telephone Encounter (Signed)
RC from pt.  Message left on my voicemail.  RC to pt.  Advised we can see him first.  He is still having back pain.  Pt will schedule appt to see Dr. Milinda Cave tomorrow.  Due to weather and needing to speak with his wife, he will call tomorrow to make appt.

## 2011-02-22 ENCOUNTER — Encounter: Payer: Self-pay | Admitting: Family Medicine

## 2011-02-22 ENCOUNTER — Ambulatory Visit (INDEPENDENT_AMBULATORY_CARE_PROVIDER_SITE_OTHER): Payer: BC Managed Care – PPO | Admitting: Family Medicine

## 2011-02-22 VITALS — BP 138/75 | HR 78 | Temp 98.4°F | Ht 66.5 in | Wt 179.1 lb

## 2011-02-22 DIAGNOSIS — S20229A Contusion of unspecified back wall of thorax, initial encounter: Secondary | ICD-10-CM

## 2011-02-22 DIAGNOSIS — M545 Low back pain: Secondary | ICD-10-CM

## 2011-02-22 DIAGNOSIS — S300XXA Contusion of lower back and pelvis, initial encounter: Secondary | ICD-10-CM

## 2011-02-22 NOTE — Patient Instructions (Signed)
Back Exercises These exercises may help you when beginning to rehabilitate your injury. Your symptoms may resolve with or without further involvement from your physician, physical therapist or athletic trainer. While completing these exercises, remember:   Restoring tissue flexibility helps normal motion to return to the joints. This allows healthier, less painful movement and activity.   An effective stretch should be held for at least 30 seconds.   A stretch should never be painful. You should only feel a gentle lengthening or release in the stretched tissue.  STRETCH - Extension, Prone on Elbows   Lie on your stomach on the floor, a bed will be too soft. Place your palms about shoulder width apart and at the height of your head.   Place your elbows under your shoulders. If this is too painful, stack pillows under your chest.   Allow your body to relax so that your hips drop lower and make contact more completely with the floor.   Hold this position for __________ seconds.   Slowly return to lying flat on the floor.  Repeat __________ times. Complete this exercise __________ times per day.  RANGE OF MOTION - Extension, Prone Press Ups   Lie on your stomach on the floor, a bed will be too soft. Place your palms about shoulder width apart and at the height of your head.   Keeping your back as relaxed as possible, slowly straighten your elbows while keeping your hips on the floor. You may adjust the placement of your hands to maximize your comfort. As you gain motion, your hands will come more underneath your shoulders.   Hold this position __________ seconds.   Slowly return to lying flat on the floor.  Repeat __________ times. Complete this exercise __________ times per day.  RANGE OF MOTION- Quadruped, Neutral Spine   Assume a hands and knees position on a firm surface. Keep your hands under your shoulders and your knees under your hips. You may place padding under your knees for  comfort.   Drop your head and point your tail bone toward the ground below you. This will round out your low back like an angry cat. Hold this position for __________ seconds.   Slowly lift your head and release your tail bone so that your back sags into a large arch, like an old horse.   Hold this position for __________ seconds.   Repeat this until you feel limber in your low back.   Now, find your "sweet spot." This will be the most comfortable position somewhere between the two previous positions. This is your neutral spine. Once you have found this position, tense your stomach muscles to support your low back.   Hold this position for __________ seconds.  Repeat __________ times. Complete this exercise __________ times per day.  STRETCH - Flexion, Single Knee to Chest   Lie on a firm bed or floor with both legs extended in front of you.   Keeping one leg in contact with the floor, bring your opposite knee to your chest. Hold your leg in place by either grabbing behind your thigh or at your knee.   Pull until you feel a gentle stretch in your low back. Hold __________ seconds.   Slowly release your grasp and repeat the exercise with the opposite side.  Repeat __________ times. Complete this exercise __________ times per day.  STRETCH - Hamstrings, Standing  Stand or sit and extend your right / left leg, placing your foot on a chair   or foot stool   Keeping a slight arch in your low back and your hips straight forward.   Lead with your chest and lean forward at the waist until you feel a gentle stretch in the back of your right / left knee or thigh. (When done correctly, this exercise requires leaning only a small distance.)   Hold this position for __________ seconds.  Repeat __________ times. Complete this stretch __________ times per day. STRENGTHENING - Deep Abdominals, Pelvic Tilt   Lie on a firm bed or floor. Keeping your legs in front of you, bend your knees so they are  both pointed toward the ceiling and your feet are flat on the floor.   Tense your lower abdominal muscles to press your low back into the floor. This motion will rotate your pelvis so that your tail bone is scooping upwards rather than pointing at your feet or into the floor.   With a gentle tension and even breathing, hold this position for __________ seconds.  Repeat __________ times. Complete this exercise __________ times per day.  STRENGTHENING - Abdominals, Crunches   Lie on a firm bed or floor. Keeping your legs in front of you, bend your knees so they are both pointed toward the ceiling and your feet are flat on the floor. Cross your arms over your chest.   Slightly tip your chin down without bending your neck.   Tense your abdominals and slowly lift your trunk high enough to just clear your shoulder blades. Lifting higher can put excessive stress on the low back and does not further strengthen your abdominal muscles.   Control your return to the starting position.  Repeat __________ times. Complete this exercise __________ times per day.  STRENGTHENING - Quadruped, Opposite UE/LE Lift   Assume a hands and knees position on a firm surface. Keep your hands under your shoulders and your knees under your hips. You may place padding under your knees for comfort.   Find your neutral spine and gently tense your abdominal muscles so that you can maintain this position. Your shoulders and hips should form a rectangle that is parallel with the floor and is not twisted.   Keeping your trunk steady, lift your right hand no higher than your shoulder and then your left leg no higher than your hip. Make sure you are not holding your breath. Hold this position __________ seconds.   Continuing to keep your abdominal muscles tense and your back steady, slowly return to your starting position. Repeat with the opposite arm and leg.  Repeat __________ times. Complete this exercise __________ times per  day. Document Released: 02/05/2005 Document Revised: 09/30/2010 Document Reviewed: 05/02/2008 [employer] Patient Information 2012 [employer]. 

## 2011-02-22 NOTE — Progress Notes (Signed)
OFFICE NOTE  02/22/2011  CC:  Chief Complaint  Patient presents with  . fell down stairs    X 2 weeks ago- lower back hurting     HPI: Patient is a 31 y.o. [place] male who is here for back pain s/p fall. About 2-3 wks ago he was walking down wood steps in socks and slipped and fell onto his low back. Initial pain not so bad, took advil and it wasn't so bad.  The last week or so, though, he has had a [place] of long walking and [place], plus a long day of sitting and doing nothing. On rare occasion has felt slight tingling in left leg down to knee level, also brief tingling on left foot great toe. No weakness, no radiation of pain.  Bowel and bladder function normal. Worse with prolonged sitting or standing and carrying things. Better with advil but minimally so.  Pertinent PMH:  Past Medical History  Diagnosis Date  . Asthma   . Allergic rhinitis     ragweed  . Herpes simplex     genital, recurrent  . Anxiety     Failed trial of wellbutrin in the past  . 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 Elevated blood pressure reading without diagnosis of hypertension   No hx of any past back problems.  MEDS:  Outpatient Prescriptions Prior to Visit  Medication Sig Dispense Refill  . cloNIDine (CATAPRES) 0.1 MG tablet 1-3 tabs po qhs for insomnia  70 tablet  2  . NASONEX 50 MCG/ACT nasal spray 2 sprays each nostril once daily  17 g  10  . albuterol (PROAIR HFA) 108 (90 BASE) MCG/ACT inhaler Inhale 2 puffs into the lungs every 6 (six) hours as needed for wheezing.  1 Inhaler  1  . fexofenadine (ALLEGRA) 180 MG tablet Take 1 tablet (180 mg total) by mouth daily.  30 tablet  11  . acyclovir (ZOVIRAX) 400 MG tablet Take 1 tablet by mouth twice daily, may increase to three times daily x 5 days for outbreak  75 tablet  5    PE: Blood pressure 138/75, pulse 78, temperature 98.4 F (36.9 C), temperature source Temporal, height 5' 6.5" (1.689 m),  weight 179 lb 1.9 oz (81.248 kg), SpO2 96.00%. Gen: Alert, well appearing.  Patient is oriented to person, place, time, and situation. BACK: ROM fully intact, with mild pain with full flexion, otherwise without pain.  Mild TTP in central lumbar region, esp distally, also TTP in paraspinous muscles at this level.  No bruise or rash. LE strength 5/5 prox/dist bilat.  Sitting SLR neg bilat.  DTRs: patellar and achilles areas 2+ bilat.    IMPRESSION AND PLAN: Low back contusion, r/o subtle fracture. Check L-spine x-rays.  Discussed importance of good posture, good lifting position to avoid low back stress, gave handout of low back stretching exercises and reviewed these with him today. Encouraged him to use his heating pad more, warm bath, massage, etc. Gave samples of celebrex 200mg  1 tab qd x 10d, then 1 tab qd prn--to replace his advil.  FOLLOW UP: prn

## 2011-02-24 ENCOUNTER — Ambulatory Visit (HOSPITAL_BASED_OUTPATIENT_CLINIC_OR_DEPARTMENT_OTHER)
Admission: RE | Admit: 2011-02-24 | Discharge: 2011-02-24 | Disposition: A | Discharge status: Home / Self Care | Payer: BC Managed Care – PPO | Source: Ambulatory Visit | Attending: Family Medicine | Admitting: Family Medicine

## 2011-02-24 DIAGNOSIS — M545 Low back pain, unspecified: Secondary | ICD-10-CM

## 2011-02-24 DIAGNOSIS — S300XXA Contusion of lower back and pelvis, initial encounter: Secondary | ICD-10-CM

## 2011-03-29 ENCOUNTER — Telehealth: Payer: Self-pay | Admitting: Family Medicine

## 2011-03-29 NOTE — Telephone Encounter (Signed)
Patient picked up.

## 2011-03-29 NOTE — Telephone Encounter (Signed)
Patient is request celebrex samples, he said Dr Milinda Cave mentioned them in his last OV but he didn't get any that day, patient will stop by the office

## 2011-03-29 NOTE — Telephone Encounter (Signed)
Review of OV note on 02/22/11 shows Dr. Milinda Cave gave samples.  Pt advised Diane that he did not receive.  Pt given 12 days of samples.  Pt to call and request RX to be sent in if he needs further samples.

## 2011-04-29 ENCOUNTER — Other Ambulatory Visit: Payer: Self-pay | Admitting: *Deleted

## 2011-04-29 MED ORDER — CLONIDINE HCL 0.1 MG PO TABS: ORAL_TABLET | ORAL | Status: DC

## 2011-04-29 NOTE — Telephone Encounter (Signed)
Faxed refill request received from pharmacy for Last filled on 12/21/10, #70 x 2 Last seen on 12/21/10 for insomnia Follow up needed in January 2013, but was seen at that time for back pain. Please advise refills.

## 2011-07-08 ENCOUNTER — Ambulatory Visit (INDEPENDENT_AMBULATORY_CARE_PROVIDER_SITE_OTHER): Payer: BC Managed Care – PPO | Admitting: Family Medicine

## 2011-07-08 ENCOUNTER — Encounter: Payer: Self-pay | Admitting: Family Medicine

## 2011-07-08 VITALS — BP 118/82 | HR 67 | Temp 98.0°F | Ht 66.5 in | Wt 180.0 lb

## 2011-07-08 DIAGNOSIS — IMO0002 Reserved for concepts with insufficient information to code with codable children: Secondary | ICD-10-CM

## 2011-07-08 DIAGNOSIS — J45909 Unspecified asthma, uncomplicated: Secondary | ICD-10-CM

## 2011-07-08 DIAGNOSIS — J309 Allergic rhinitis, unspecified: Secondary | ICD-10-CM

## 2011-07-08 MED ORDER — FLUTICASONE-SALMETEROL 100-50 MCG/DOSE IN AEPB: 1.0000 | INHALATION_SPRAY | Freq: Two times a day (BID) | RESPIRATORY_TRACT | Status: DC

## 2011-07-08 NOTE — Assessment & Plan Note (Signed)
Continue nasonex and take every day. Restart allegra 180mg  qd. Use saline nasal spray prn.

## 2011-07-08 NOTE — Progress Notes (Signed)
OFFICE VISIT  07/08/2011   CC:  Chief Complaint  Patient presents with  . Asthma    X 1 week  . head foginess     HPI:    Patient is a 31 y.o. Hispanic male who presents for asthma/allergy sx's. Says he's been feeling more head congestion, "foggy headed", wonders if he should get back on his allegra. He takes flonase but it sounds like inconsistently. About 2 weeks ago he had a couple of days in which he had chest tightness and wheezing and had to take his albuterol multiple times.  He then restarted an advair sample that I had given him in the past and has been taking 1 puff bid since then and has not required any further albuterol.  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   We reviewed his labs from last year: all normal.  Past Medical History  Diagnosis Date  . Asthma   . Allergic rhinitis     ragweed  . Herpes simplex     genital, recurrent  . Anxiety     Failed trial of wellbutrin in the past  . 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 Elevated blood pressure reading without diagnosis of hypertension   . Insomnia 10/21/2010    History reviewed. No pertinent past surgical history.  Outpatient Prescriptions Prior to Visit  Medication Sig Dispense Refill  . albuterol (PROAIR HFA) 108 (90 BASE) MCG/ACT inhaler Inhale 2 puffs into the lungs every 6 (six) hours as needed for wheezing.  1 Inhaler  1  . cloNIDine (CATAPRES) 0.1 MG tablet 1-3 tabs po qhs for insomnia  70 tablet  6  . celecoxib (CELEBREX) 200 MG capsule Take 200 mg by mouth daily.      . fexofenadine (ALLEGRA) 180 MG tablet Take 1 tablet (180 mg total) by mouth daily.  30 tablet  11  . NASONEX 50 MCG/ACT nasal spray 2 sprays each nostril once daily  17 g  10    No Known Allergies  ROS As per HPI  PE: Blood pressure 118/82, pulse 67, temperature 98 F (36.7 C), temperature source Temporal, height 5' 6.5"  (1.689 m), weight 180 lb (81.647 kg), SpO2 98.00%. Gen: Alert, well appearing.  Patient is oriented to person, place, time, and situation. ENT: Ears: EACs clear, normal epithelium.  TMs with good light reflex and landmarks bilaterally.  Eyes: no injection, icteris, swelling, or exudate.  EOMI, PERRLA. Nose: no drainage or turbinate edema/swelling.  No injection or focal lesion.  Mouth: lips without lesion/swelling.  Oral mucosa pink and moist.  Dentition intact and without obvious caries or gingival swelling.  Oropharynx without erythema, exudate, or swelling.  Neck - No masses or thyromegaly or limitation in range of motion CV: RRR, no m/r/g.   LUNGS: CTA bilat, nonlabored resps, good aeration in all lung fields. EXT: no clubbing, cyanosis, or edema.    LABS:  none  IMPRESSION AND PLAN:  Asthma, persistent His disease has been mild, with just a mild "flair" recently. He should stay on a controller med--his advair --for the next 2 weeks, then ween off this over the next week after that.   I gave another sample of advair 100/50 and sent in rx for the same.  Chronic allergic rhinitis Continue nasonex and take every day. Restart allegra 180mg  qd. Use saline nasal spray prn.  FOLLOW UP: Return if symptoms worsen or fail to improve.

## 2011-07-08 NOTE — Assessment & Plan Note (Signed)
His disease has been mild, with just a mild "flair" recently. He should stay on a controller med--his advair --for the next 2 weeks, then ween off this over the next week after that.   I gave another sample of advair 100/50 and sent in rx for the same.

## 2012-01-03 ENCOUNTER — Other Ambulatory Visit: Payer: Self-pay | Admitting: *Deleted

## 2012-01-03 MED ORDER — CLONIDINE HCL 0.1 MG PO TABS: ORAL_TABLET | ORAL | Status: DC

## 2012-01-03 NOTE — Telephone Encounter (Signed)
Refill request for CLONIDINE Last filled- 04/29/11, #70 X 6 Last seen- 6/6 -ACUTE, 12/21/10 for insomnia Follow up - missed follow up for insomnia in 02/2011 Please advise refills, follow up.

## 2012-01-03 NOTE — Telephone Encounter (Signed)
I authorized 1 month supply with one additional RF.  Please notify pt that routine insomnia med f/u in office is needed sometime in the next 2 months or no further RFs can be given.--thx

## 2012-01-04 NOTE — Telephone Encounter (Signed)
Message left for patient to return call.

## 2012-01-04 NOTE — Telephone Encounter (Signed)
Advised pt.  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  Unable to do right now.  He will call before last refill runs out to follow up situation.  Again advised that needs OV for more refills.

## 2012-02-14 ENCOUNTER — Other Ambulatory Visit: Payer: Self-pay | Admitting: *Deleted

## 2012-02-14 MED ORDER — CLONIDINE HCL 0.1 MG PO TABS: ORAL_TABLET | ORAL | Status: DC

## 2012-02-14 NOTE — Telephone Encounter (Signed)
I approved this RF as previously prescribed.  -thx

## 2012-02-14 NOTE — Telephone Encounter (Signed)
Message left to return call to schedule appt.

## 2012-02-14 NOTE — Telephone Encounter (Signed)
PC from patient stating that he is out of refills for clonidine.  Last filled on 01/03/12 #70 x 1 (46.6 day supply if taking 3 QHS--today is day 43), and pt was made aware that he would have to have an office visit prior to any additional refills.  Message left today stating that he is aware he needs appt.  Pt states he is able to come Wed-Fri of next week, but can not come this week.  Pt is in school and states he has been more stressed lately with school and job interviews.  Pt is requesting refill to last until appt.  No appt scheduled yet. Please advise.

## 2012-02-14 NOTE — Telephone Encounter (Signed)
RC from pt.  He states he will not know his schedule until Friday.  He will call at that time to schedule for next Wednesday or Friday.

## 2012-03-20 ENCOUNTER — Telehealth: Payer: Self-pay | Admitting: Family Medicine

## 2012-03-20 NOTE — Telephone Encounter (Signed)
Patient needs a refill Clonidine. He is off work today & can come in this afternoon however there are no appointments avail. Patient will be working in [place] the rest of the week. Please advise.

## 2012-03-20 NOTE — Telephone Encounter (Signed)
Pt was given #70 x 1 on 02/14/12.  I called and spoke to pharmacist [facility] and was informed RX was transferred to [facility] on 02/14/12.  Per [facility] at [facility], pt has one refill remaining.  She will process for patient.  Advised pt Rx will be ready at [facility].  Advised he MUST make appt and be seen before more refills are given per 02/14/12 note.  He is agreeable and hopes he may be able to come in next week.

## 2012-04-17 ENCOUNTER — Encounter: Payer: Self-pay | Admitting: Family Medicine

## 2012-04-17 ENCOUNTER — Ambulatory Visit (INDEPENDENT_AMBULATORY_CARE_PROVIDER_SITE_OTHER): Payer: BC Managed Care – PPO | Admitting: Family Medicine

## 2012-04-17 VITALS — BP 134/86 | HR 72 | Temp 98.4°F | Ht 66.5 in | Wt 182.0 lb

## 2012-04-17 DIAGNOSIS — J45909 Unspecified asthma, uncomplicated: Secondary | ICD-10-CM

## 2012-04-17 DIAGNOSIS — IMO0002 Reserved for concepts with insufficient information to code with codable children: Secondary | ICD-10-CM

## 2012-04-17 DIAGNOSIS — G4700 Insomnia, unspecified: Secondary | ICD-10-CM

## 2012-04-17 DIAGNOSIS — J309 Allergic rhinitis, unspecified: Secondary | ICD-10-CM

## 2012-04-17 DIAGNOSIS — Z8619 Personal history of other infectious and parasitic diseases: Secondary | ICD-10-CM

## 2012-04-17 MED ORDER — FLUTICASONE-SALMETEROL 100-50 MCG/DOSE IN AEPB: 1.0000 | INHALATION_SPRAY | Freq: Two times a day (BID) | RESPIRATORY_TRACT | Status: DC

## 2012-04-17 MED ORDER — FLUTICASONE PROPIONATE 50 MCG/ACT NA SUSP: 2.0000 | Freq: Every day | NASAL | Status: DC

## 2012-04-17 MED ORDER — CLONIDINE HCL 0.1 MG PO TABS: ORAL_TABLET | ORAL | Status: DC

## 2012-04-17 MED ORDER — ACYCLOVIR 400 MG PO TABS: 400.0000 mg | ORAL_TABLET | Freq: Two times a day (BID) | ORAL | Status: DC

## 2012-04-17 MED ORDER — ALBUTEROL SULFATE HFA 108 (90 BASE) MCG/ACT IN AERS: 2.0000 | INHALATION_SPRAY | Freq: Four times a day (QID) | RESPIRATORY_TRACT | Status: DC | PRN

## 2012-04-17 MED ORDER — FEXOFENADINE HCL 180 MG PO TABS: 180.0000 mg | ORAL_TABLET | Freq: Every day | ORAL | Status: DC

## 2012-04-17 NOTE — Assessment & Plan Note (Signed)
Doing well on 2 of the clonidine 0.1mg  tabs qhs. Continue this, rx given today.

## 2012-04-17 NOTE — Assessment & Plan Note (Signed)
Restart suppression therapy: acyclovir 400mg  bid.

## 2012-04-17 NOTE — Assessment & Plan Note (Signed)
Problem stable.  Continue current medications and diet appropriate for this condition.  We have reviewed our general long term plan for this problem and also reviewed symptoms and signs that should prompt the patient to call or return to the office.  

## 2012-04-17 NOTE — Assessment & Plan Note (Signed)
Add saline nasal spray 1-2 times per day, add humidifier in bedroom. Continue daily flonase.

## 2012-04-17 NOTE — Progress Notes (Signed)
OFFICE NOTE  04/17/2012  CC:  Chief Complaint  Patient presents with  . Follow-up    BP, asthma     HPI: Patient is a 32 y.o. Caucasian male who is here for 9 mo f/u all rhin, asthma, insomnia. Aller rhin: flonase helps a lot; having some nose bleeds lately, not using humidifier or nasal saline spray. Asthma: he was off of controller therapy up until a few weeks ago b/c he was asymptomatic.  He restarted advair b/c he started having more coughing and wheezing a few weeks ago; this has helped and he is using his proAir rescue inhaler much less now. Insomnia: doing great on TWO clonidine 0.1mg  qhs.  NO side effects.  Has hx of genital herpes.  Was on chronic suppression therapy in the past but ran out of meds and has been doing well off of it.  However, he asks for RF of acyclovir so he can resume suppression therapy.   Pertinent PMH:  Past Medical History  Diagnosis Date  . Asthma   . Allergic rhinitis     ragweed  . Herpes simplex     genital, recurrent  . Anxiety     Failed trial of wellbutrin in the past  . 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 Elevated blood pressure reading without diagnosis of hypertension   . Insomnia 10/21/2010    MEDS:  Outpatient Prescriptions Prior to Visit  Medication Sig Dispense Refill  . albuterol (PROAIR HFA) 108 (90 BASE) MCG/ACT inhaler Inhale 2 puffs into the lungs every 6 (six) hours as needed for wheezing.  1 Inhaler  1  . cloNIDine (CATAPRES) 0.1 MG tablet 1-3 tabs po qhs for insomnia  70 tablet  1  . fexofenadine (ALLEGRA) 180 MG tablet Take 1 tablet (180 mg total) by mouth daily.  30 tablet  11  . fluticasone (FLONASE) 50 MCG/ACT nasal spray Place 2 sprays into the nose daily.      . Fluticasone-Salmeterol (ADVAIR DISKUS) 100-50 MCG/DOSE AEPB Inhale 1 puff into the lungs every 12 (twelve) hours.  60 each  6  . celecoxib (CELEBREX) 200 MG capsule Take 200 mg by mouth daily.      Marland Kitchen NASONEX 50 MCG/ACT nasal spray 2  sprays each nostril once daily  17 g  10   No facility-administered medications prior to visit.    PE: Blood pressure 134/86, pulse 72, temperature 98.4 F (36.9 C), temperature source Temporal, height 5' 6.5" (1.689 m), weight 182 lb (82.555 kg). Gen: Alert, well appearing.  Patient is oriented to person, place, time, and situation. HEENT: eyes without injection, drainage, or swelling.  Ears: EACs clear, TMs with normal light reflex and landmarks.  Nose: Clear rhinorrhea, with some dried, crusty exudate adherent to mildly injected mucosa.  No purulent d/c.  No paranasal sinus TTP.  No facial swelling.  Throat and mouth without focal lesion.  No pharyngial swelling, erythema, or exudate.   Neck: supple, no LAD.   LUNGS: CTA bilat, nonlabored resps.   CV: RRR, no m/r/g. EXT: no c/c/e SKIN: no rash   IMPRESSION AND PLAN: Asthma, persistent Problem stable.  Continue current medications and diet appropriate for this condition.  We have reviewed our general long term plan for this problem and also reviewed symptoms and signs that should prompt the patient to call or return to the office.   Chronic allergic rhinitis Add saline nasal spray 1-2 times per day, add humidifier in  bedroom. Continue daily flonase.  Insomnia Doing well on 2 of the clonidine 0.1mg  tabs qhs. Continue this, rx given today.  History of herpes genitalis Restart suppression therapy: acyclovir 400mg  bid.   An After Visit Summary was printed and given to the patient.  FOLLOW UP: prn--pt moving to [place] in 2 months, so we'll likely not see him again for a while.  He says he may be moving back in a couple of years.  At any rate, I gave him 6 RFs on all meds to give him time to establish care with an MD in [place] [facility]).

## 2012-04-24 ENCOUNTER — Other Ambulatory Visit: Payer: Self-pay | Admitting: Family Medicine

## 2012-04-24 NOTE — Telephone Encounter (Signed)
RX refused.  Pt given script on 04/17/12 #60 x 6 at office visit.

## 2012-04-28 ENCOUNTER — Other Ambulatory Visit: Payer: Self-pay | Admitting: Family Medicine

## 2012-04-28 NOTE — Telephone Encounter (Signed)
Pharmacy states they did not receive Rx sent on 03.17.14 #60x6; gave VO via phone/SLS

## 2012-05-02 DIAGNOSIS — J9819 Other pulmonary collapse: Secondary | ICD-10-CM

## 2012-05-02 HISTORY — DX: Other pulmonary collapse: J98.19

## 2012-05-10 ENCOUNTER — Inpatient Hospital Stay (HOSPITAL_COMMUNITY)
Admission: EM | Admit: 2012-05-10 | Discharge: 2012-05-11 | DRG: 584 | Disposition: A | Discharge status: Home / Self Care | Payer: BC Managed Care – PPO | Attending: Internal Medicine | Admitting: Internal Medicine

## 2012-05-10 ENCOUNTER — Emergency Department (HOSPITAL_COMMUNITY): Payer: BC Managed Care – PPO

## 2012-05-10 ENCOUNTER — Encounter (HOSPITAL_COMMUNITY): Payer: Self-pay | Admitting: Family Medicine

## 2012-05-10 DIAGNOSIS — J189 Pneumonia, unspecified organism: Secondary | ICD-10-CM

## 2012-05-10 DIAGNOSIS — IMO0002 Reserved for concepts with insufficient information to code with codable children: Secondary | ICD-10-CM | POA: Diagnosis present

## 2012-05-10 DIAGNOSIS — G4700 Insomnia, unspecified: Secondary | ICD-10-CM

## 2012-05-10 DIAGNOSIS — J309 Allergic rhinitis, unspecified: Secondary | ICD-10-CM

## 2012-05-10 DIAGNOSIS — Z79899 Other long term (current) drug therapy: Secondary | ICD-10-CM

## 2012-05-10 DIAGNOSIS — A419 Sepsis, unspecified organism: Secondary | ICD-10-CM | POA: Diagnosis present

## 2012-05-10 DIAGNOSIS — R042 Hemoptysis: Secondary | ICD-10-CM

## 2012-05-10 DIAGNOSIS — J45901 Unspecified asthma with (acute) exacerbation: Secondary | ICD-10-CM | POA: Diagnosis present

## 2012-05-10 DIAGNOSIS — E876 Hypokalemia: Secondary | ICD-10-CM | POA: Diagnosis present

## 2012-05-10 DIAGNOSIS — A6000 Herpesviral infection of urogenital system, unspecified: Secondary | ICD-10-CM | POA: Diagnosis present

## 2012-05-10 DIAGNOSIS — R030 Elevated blood-pressure reading, without diagnosis of hypertension: Secondary | ICD-10-CM

## 2012-05-10 DIAGNOSIS — F411 Generalized anxiety disorder: Secondary | ICD-10-CM | POA: Diagnosis present

## 2012-05-10 DIAGNOSIS — I959 Hypotension, unspecified: Secondary | ICD-10-CM | POA: Diagnosis present

## 2012-05-10 DIAGNOSIS — J13 Pneumonia due to Streptococcus pneumoniae: Secondary | ICD-10-CM | POA: Diagnosis present

## 2012-05-10 DIAGNOSIS — R071 Chest pain on breathing: Secondary | ICD-10-CM

## 2012-05-10 DIAGNOSIS — Z8619 Personal history of other infectious and parasitic diseases: Secondary | ICD-10-CM

## 2012-05-10 DIAGNOSIS — R079 Chest pain, unspecified: Secondary | ICD-10-CM | POA: Diagnosis present

## 2012-05-10 DIAGNOSIS — J9819 Other pulmonary collapse: Secondary | ICD-10-CM | POA: Diagnosis present

## 2012-05-10 LAB — CBC WITH DIFFERENTIAL/PLATELET
Basophils Absolute: 0 10*3/uL (ref 0.0–0.1)
Eosinophils Absolute: 0 10*3/uL (ref 0.0–0.7)
Hemoglobin: 15.2 g/dL (ref 13.0–17.0)
Lymphocytes Relative: 2 % — ABNORMAL LOW (ref 12–46)
MCHC: 35.9 g/dL (ref 30.0–36.0)
Monocytes Relative: 6 % (ref 3–12)
Neutrophils Relative %: 92 % — ABNORMAL HIGH (ref 43–77)
RDW: 12.3 % (ref 11.5–15.5)
WBC: 26.6 10*3/uL — ABNORMAL HIGH (ref 4.0–10.5)

## 2012-05-10 LAB — BASIC METABOLIC PANEL
BUN: 12 mg/dL (ref 6–23)
Chloride: 99 mEq/L (ref 96–112)
Creatinine, Ser: 1.16 mg/dL (ref 0.50–1.35)
GFR calc Af Amer: >90 mL/min (ref >90)
GFR calc non Af Amer: 83 mL/min — ABNORMAL LOW (ref >90)
Potassium: 3.4 mEq/L — ABNORMAL LOW (ref 3.5–5.1)

## 2012-05-10 MED ORDER — DEXTROSE 5 % IV SOLN
1.0000 g | Freq: Once | INTRAVENOUS | Status: AC
  Administered 2012-05-10: 1 g via INTRAVENOUS
  Filled 2012-05-10: qty 10

## 2012-05-10 MED ORDER — SODIUM CHLORIDE 0.9 % IV BOLUS (SEPSIS)
1000.0000 mL | Freq: Once | INTRAVENOUS | Status: AC
  Administered 2012-05-10: 1000 mL via INTRAVENOUS

## 2012-05-10 MED ORDER — ACETAMINOPHEN 325 MG PO TABS
650.0000 mg | ORAL_TABLET | Freq: Once | ORAL | Status: AC
Start: 2012-05-10 — End: 2012-05-10
  Administered 2012-05-10: 650 mg via ORAL
  Filled 2012-05-10: qty 2

## 2012-05-10 MED ORDER — DEXTROSE 5 % IV SOLN
500.0000 mg | Freq: Once | INTRAVENOUS | Status: AC
  Administered 2012-05-10: 500 mg via INTRAVENOUS
  Filled 2012-05-10: qty 500

## 2012-05-10 MED ORDER — IBUPROFEN 800 MG PO TABS
800.0000 mg | ORAL_TABLET | Freq: Once | ORAL | Status: AC
  Administered 2012-05-10: 800 mg via ORAL
  Filled 2012-05-10: qty 1

## 2012-05-10 NOTE — ED Notes (Signed)
Pt up to walk to bathroom. Tolerated well. Stated no shortness of breath

## 2012-05-10 NOTE — ED Provider Notes (Signed)
History     CSN: [id number]  Arrival date & time 05/10/12  1815   First MD Initiated Contact with Patient 05/10/12 1912      Chief Complaint  Patient presents with  . Pneumonia     Patient is a 32 y.o. male presenting with cough. The history is provided by the patient.  Cough Cough characteristics:  Productive Sputum characteristics:  Bloody Severity:  Moderate Onset quality:  Gradual Duration:  1 day Timing:  Intermittent Progression:  Worsening Chronicity:  New Smoker: no   Relieved by:  Nothing Worsened by:  Nothing tried Associated symptoms: chest pain, chills, fever, headaches, myalgias and shortness of breath   Associated symptoms: no rash     Past Medical History  Diagnosis Date  . Asthma   . Allergic rhinitis     ragweed  . Herpes simplex     genital, recurrent  . Anxiety     Failed trial of wellbutrin in the past  . 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 Elevated blood pressure reading without diagnosis of hypertension   . Insomnia 10/21/2010    History reviewed. No pertinent past surgical history.  Family History  Problem Relation Age of Onset  . Cancer Mother     Thyroid: cured (age 6)  . Cancer Paternal Grandfather     Prostate cancer in his 8s  . Heart disease Maternal Grandfather   . Mental illness Maternal Grandfather     bipolar?    History  Substance Use Topics  . Smoking status: Never Smoker   . Smokeless tobacco: Never Used  . Alcohol Use: Yes     Comment: weekend      Review of Systems  Constitutional: Positive for fever and chills.  Respiratory: Positive for cough and shortness of breath.   Cardiovascular: Positive for chest pain.  Gastrointestinal: Negative for vomiting and diarrhea.  Musculoskeletal: Positive for myalgias.  Skin: Negative for rash.  Neurological: Positive for headaches. Negative for weakness.       Headache with cough   Psychiatric/Behavioral: Negative for agitation.  All other  systems reviewed and are negative.    Allergies  Review of patient's allergies indicates no known allergies.  Home Medications   Current Outpatient Rx  Name  Route  Sig  Dispense  Refill  . albuterol (PROAIR HFA) 108 (90 BASE) MCG/ACT inhaler   Inhalation   Inhale 2 puffs into the lungs every 6 (six) hours as needed for wheezing.   1 Inhaler   1     Dispense ProAir HFA   . cloNIDine (CATAPRES) 0.1 MG tablet   Oral   Take 0.2 mg by mouth at bedtime.         . fexofenadine (ALLEGRA) 180 MG tablet   Oral   Take 1 tablet (180 mg total) by mouth daily.   30 tablet   6   . Fluticasone-Salmeterol (ADVAIR DISKUS) 100-50 MCG/DOSE AEPB   Inhalation   Inhale 1 puff into the lungs every 12 (twelve) hours.   60 each   6   . ibuprofen (ADVIL,MOTRIN) 200 MG tablet   Oral   Take 200-400 mg by mouth every 6 (six) hours as needed for pain, fever or headache.         . triamcinolone (NASACORT) 55 MCG/ACT nasal inhaler   Nasal   Place 2 sprays into the nose daily.         Marland Kitchen acyclovir (ZOVIRAX) 400  MG tablet   Oral   Take 1 tablet (400 mg total) by mouth 2 (two) times daily.   60 tablet   6     BP 83/42  Pulse 123  Temp(Src) 100.3 F (37.9 C)  Resp 20  SpO2 98%  Physical Exam CONSTITUTIONAL: Well developed/well nourished HEAD: Normocephalic/atraumatic EYES: EOMI/PERRL ENMT: Mucous membranes moist, uvula midline, no stridor noted, NECK: supple no meningeal signs SPINE:entire spine nontender CV: tachycardic S1/S2 noted, no murmurs/rubs/gallops noted LUNGS: decreased breath sounds bilaterally, no tachypnea ABDOMEN: soft, nontender, no rebound or guarding GU:no cva tenderness NEURO: Pt is awake/alert, moves all extremitiesx4 EXTREMITIES: pulses normal, full ROM SKIN: warm, color normal PSYCH: no abnormalities of mood noted  ED Course  Procedures   Labs Reviewed  CBC WITH DIFFERENTIAL  BASIC METABOLIC PANEL  LACTIC ACID, PLASMA   8:36 PM 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 He reports CP and HA only with cough Seen at urgent care and sent for further workup He is tachycardic/hypotensive but I suspect he will respond to fluids No recent foreign travel He did recently travel to [place] He reports h/o asthma and thought that was cause of his symptoms 8:42 PM SBP improving I reviewed CXR from urgent care.  Somewhat poor quality so will repeat CXR here   12:04 AM SBP improved, but pt still tachycardic despite IV fluids.  He is in no distress otherwise.   Suspect pneumonia even though initial CXR negative.  He has fever/chills/cough.  Low suspicion for PE (recent short flight to [facility] seems to be only risk factor).  His clinical presentation is more c/w pneumonia He will place in tele on triad service D/w dr Adela Glimpse, to admit   MDM  Nursing notes including past medical history and social history reviewed and considered in documentation Labs/vital reviewed and considered         Joya Gaskins, MD 05/11/12 0007

## 2012-05-10 NOTE — ED Notes (Signed)
Pt sent here with positive PNA on chest xray and elevated WBC count. sts started feeling bad this am. Fever, cough, SOB, fatigue

## 2012-05-11 ENCOUNTER — Encounter (HOSPITAL_COMMUNITY): Payer: Self-pay | Admitting: Internal Medicine

## 2012-05-11 ENCOUNTER — Inpatient Hospital Stay (HOSPITAL_COMMUNITY): Payer: BC Managed Care – PPO

## 2012-05-11 DIAGNOSIS — I959 Hypotension, unspecified: Secondary | ICD-10-CM | POA: Diagnosis present

## 2012-05-11 DIAGNOSIS — R042 Hemoptysis: Secondary | ICD-10-CM | POA: Diagnosis present

## 2012-05-11 DIAGNOSIS — J449 Chronic obstructive pulmonary disease, unspecified: Secondary | ICD-10-CM

## 2012-05-11 DIAGNOSIS — J189 Pneumonia, unspecified organism: Secondary | ICD-10-CM | POA: Diagnosis present

## 2012-05-11 DIAGNOSIS — R071 Chest pain on breathing: Secondary | ICD-10-CM | POA: Diagnosis present

## 2012-05-11 DIAGNOSIS — R079 Chest pain, unspecified: Secondary | ICD-10-CM

## 2012-05-11 DIAGNOSIS — A419 Sepsis, unspecified organism: Secondary | ICD-10-CM | POA: Diagnosis present

## 2012-05-11 DIAGNOSIS — J45909 Unspecified asthma, uncomplicated: Secondary | ICD-10-CM

## 2012-05-11 LAB — COMPREHENSIVE METABOLIC PANEL
Albumin: 3.2 g/dL — ABNORMAL LOW (ref 3.5–5.2)
BUN: 8 mg/dL (ref 6–23)
Calcium: 8.4 mg/dL (ref 8.4–10.5)
Creatinine, Ser: 1.02 mg/dL (ref 0.50–1.35)
GFR calc Af Amer: >90 mL/min (ref >90)
Glucose, Bld: 137 mg/dL — ABNORMAL HIGH (ref 70–99)
Total Protein: 6 g/dL (ref 6.0–8.3)

## 2012-05-11 LAB — LEGIONELLA ANTIGEN, URINE: Legionella Antigen, Urine: NEGATIVE

## 2012-05-11 LAB — CBC WITH DIFFERENTIAL/PLATELET
Basophils Absolute: 0 10*3/uL (ref 0.0–0.1)
Eosinophils Relative: 0 % (ref 0–5)
HCT: 38.3 % — ABNORMAL LOW (ref 39.0–52.0)
Lymphocytes Relative: 7 % — ABNORMAL LOW (ref 12–46)
Lymphs Abs: 1.3 10*3/uL (ref 0.7–4.0)
MCV: 87.2 fL (ref 78.0–100.0)
Monocytes Absolute: 0.9 10*3/uL (ref 0.1–1.0)
Neutro Abs: 17.1 10*3/uL — ABNORMAL HIGH (ref 1.7–7.7)
RBC: 4.39 MIL/uL (ref 4.22–5.81)
WBC: 19.3 10*3/uL — ABNORMAL HIGH (ref 4.0–10.5)

## 2012-05-11 LAB — PROCALCITONIN
Procalcitonin: 4.93 ng/mL
Procalcitonin: 4.06 ng/mL

## 2012-05-11 LAB — URINALYSIS, ROUTINE W REFLEX MICROSCOPIC
Bilirubin Urine: NEGATIVE
Leukocytes, UA: NEGATIVE
Nitrite: NEGATIVE
Specific Gravity, Urine: 1.01 (ref 1.005–1.030)
Urobilinogen, UA: 1 mg/dL (ref 0.0–1.0)

## 2012-05-11 LAB — INFLUENZA PANEL BY PCR (TYPE A & B)
H1N1 flu by pcr: NOT DETECTED
Influenza B By PCR: NEGATIVE

## 2012-05-11 LAB — HEPATIC FUNCTION PANEL
Albumin: 3.3 g/dL — ABNORMAL LOW (ref 3.5–5.2)
Total Bilirubin: 0.6 mg/dL (ref 0.3–1.2)

## 2012-05-11 LAB — STREP PNEUMONIAE URINARY ANTIGEN: Strep Pneumo Urinary Antigen: POSITIVE — AB

## 2012-05-11 LAB — LACTIC ACID, PLASMA: Lactic Acid, Venous: 1.6 mmol/L (ref 0.5–2.2)

## 2012-05-11 LAB — EXPECTORATED SPUTUM ASSESSMENT W GRAM STAIN, RFLX TO RESP C

## 2012-05-11 LAB — HIV ANTIBODY (ROUTINE TESTING W REFLEX): HIV: NONREACTIVE

## 2012-05-11 MED ORDER — ACETAMINOPHEN 325 MG PO TABS: 650.0000 mg | ORAL_TABLET | Freq: Four times a day (QID) | ORAL | Status: DC | PRN

## 2012-05-11 MED ORDER — MOMETASONE FURO-FORMOTEROL FUM 100-5 MCG/ACT IN AERO
2.0000 | INHALATION_SPRAY | Freq: Two times a day (BID) | RESPIRATORY_TRACT | Status: DC
  Administered 2012-05-11: 2 via RESPIRATORY_TRACT
  Filled 2012-05-11: qty 8.8

## 2012-05-11 MED ORDER — DEXTROSE 5 % IV SOLN
500.0000 mg | INTRAVENOUS | Status: DC
  Filled 2012-05-11: qty 500

## 2012-05-11 MED ORDER — LEVALBUTEROL HCL 0.63 MG/3ML IN NEBU: 0.6300 mg | INHALATION_SOLUTION | RESPIRATORY_TRACT | Status: DC | PRN

## 2012-05-11 MED ORDER — PANTOPRAZOLE SODIUM 40 MG PO TBEC
40.0000 mg | DELAYED_RELEASE_TABLET | Freq: Every day | ORAL | Status: DC
  Administered 2012-05-11: 40 mg via ORAL
  Filled 2012-05-11: qty 1

## 2012-05-11 MED ORDER — SODIUM CHLORIDE 0.9 % IV SOLN
INTRAVENOUS | Status: DC
  Administered 2012-05-11: 04:00:00 via INTRAVENOUS

## 2012-05-11 MED ORDER — ENOXAPARIN SODIUM 40 MG/0.4ML ~~LOC~~ SOLN
40.0000 mg | SUBCUTANEOUS | Status: DC
  Administered 2012-05-11: 40 mg via SUBCUTANEOUS
  Filled 2012-05-11: qty 0.4

## 2012-05-11 MED ORDER — VANCOMYCIN HCL IN DEXTROSE 1-5 GM/200ML-% IV SOLN
1000.0000 mg | Freq: Three times a day (TID) | INTRAVENOUS | Status: DC
  Administered 2012-05-11 (×2): 1000 mg via INTRAVENOUS
  Filled 2012-05-11 (×4): qty 200

## 2012-05-11 MED ORDER — MORPHINE SULFATE 2 MG/ML IJ SOLN
2.0000 mg | Freq: Once | INTRAMUSCULAR | Status: AC
  Administered 2012-05-11: 2 mg via INTRAVENOUS
  Filled 2012-05-11: qty 1

## 2012-05-11 MED ORDER — MOXIFLOXACIN HCL 400 MG PO TABS: 400.0000 mg | ORAL_TABLET | Freq: Every day | ORAL | Status: DC

## 2012-05-11 MED ORDER — POTASSIUM CHLORIDE CRYS ER 20 MEQ PO TBCR
20.0000 meq | EXTENDED_RELEASE_TABLET | Freq: Once | ORAL | Status: AC
  Administered 2012-05-11: 20 meq via ORAL
  Filled 2012-05-11: qty 1

## 2012-05-11 MED ORDER — DEXTROSE 5 % IV SOLN
1.0000 g | INTRAVENOUS | Status: DC
  Filled 2012-05-11: qty 10

## 2012-05-11 MED ORDER — VANCOMYCIN HCL IN DEXTROSE 750-5 MG/150ML-% IV SOLN
750.0000 mg | Freq: Three times a day (TID) | INTRAVENOUS | Status: DC
  Filled 2012-05-11 (×2): qty 150

## 2012-05-11 NOTE — Progress Notes (Signed)
Patient being discharged home per MD order. All discharge instructions were given and patient verbalized understanding.

## 2012-05-11 NOTE — H&P (Signed)
PCP: Jeoffrey Massed, MD    Chief Complaint:   Cough, fever, hemoptysis  HPI: Matthew Petty is a 32 y.o. male   has a past medical history of Asthma; Allergic rhinitis; Herpes simplex; Anxiety; Positive PPD; Elevated blood pressure reading without diagnosis of hypertension; and Insomnia (10/21/2010).   Presented with  2 weeks of cough but for the past 5 days it has gotten worse and for the past 1 day he started to have hemoptysis, fever up 104, and pleuritic chest pain. He inititaly came to urgent care and was told to come to ER initial CXR was worrisome for PNA but repeat portable has been negative. He denies any leg swelling. Over the weekend he flew to [place]. He have had some nausea but no vomiting and no diarrhea. He reports some shortness of breath as well.  His son who is 23 months have had a cough and a fever.   Review of Systems:    Pertinent positives include: Fevers, chills, fatigue, chest pain,  shortness of breath at rest,  productive cough,  coughing up of blood. Constitutional:  No weight loss, night sweats,  weight loss  HEENT:  No headaches, Difficulty swallowing,Tooth/dental problems,Sore throat,  No sneezing, itching, ear ache, nasal congestion, post nasal drip,  Cardio-vascular:  No  Orthopnea, PND, anasarca, dizziness, palpitations.no Bilateral lower extremity swelling  GI:  No heartburn, indigestion, abdominal pain, nausea, vomiting, diarrhea, change in bowel habits, loss of appetite, melena, blood in stool, hematemesis Resp:   No excess mucus, .No change in color of mucus.No wheezing. Skin:  no rash or lesions. No jaundice GU:  no dysuria, change in color of urine, no urgency or frequency. No straining to urinate.  No flank pain.  Musculoskeletal:  No joint pain or no joint swelling. No decreased range of motion. No back pain.  Psych:  No change in mood or affect. No depression or anxiety. No memory loss.  Neuro: no localizing neurological complaints,  no tingling, no weakness, no double vision, no gait abnormality, no slurred speech, no confusion  Otherwise ROS are negative except for above, 10 systems were reviewed  Past Medical History: Past Medical History  Diagnosis Date  . Asthma   . Allergic rhinitis     ragweed  . Herpes simplex     genital, recurrent  . Anxiety     Failed trial of wellbutrin in the past  . 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 Elevated blood pressure reading without diagnosis of hypertension   . Insomnia 10/21/2010   History reviewed. No pertinent past surgical history.   Medications: Prior to Admission medications   Medication Sig Start Date End Date Taking? Authorizing Provider  albuterol (PROAIR HFA) 108 (90 BASE) MCG/ACT inhaler Inhale 2 puffs into the lungs every 6 (six) hours as needed for wheezing. 04/17/12  Yes Jeoffrey Massed, MD  cloNIDine (CATAPRES) 0.1 MG tablet Take 0.2 mg by mouth at bedtime.   Yes Historical Provider, MD  fexofenadine (ALLEGRA) 180 MG tablet Take 1 tablet (180 mg total) by mouth daily. 04/17/12  Yes Jeoffrey Massed, MD  Fluticasone-Salmeterol (ADVAIR DISKUS) 100-50 MCG/DOSE AEPB Inhale 1 puff into the lungs every 12 (twelve) hours. 04/17/12  Yes Jeoffrey Massed, MD  ibuprofen (ADVIL,MOTRIN) 200 MG tablet Take 200-400 mg by mouth every 6 (six) hours as needed for pain, fever or headache.   Yes Historical Provider, MD  triamcinolone (NASACORT) 55 MCG/ACT nasal inhaler Place  2 sprays into the nose daily.   Yes Historical Provider, MD  acyclovir (ZOVIRAX) 400 MG tablet Take 1 tablet (400 mg total) by mouth 2 (two) times daily. 04/17/12   Jeoffrey Massed, MD    Allergies:  No Known Allergies  Social History:  Ambulatory  independently   Lives at  home   reports that he has never smoked. He has never used smokeless tobacco. He reports that  drinks alcohol. He reports that he does not use illicit drugs.   Family History: family history includes Cancer  in his mother and paternal grandfather; Heart disease in his maternal grandfather; and Mental illness in his maternal grandfather.    Physical Exam: Patient Vitals for the past 24 hrs:  BP Temp Temp src Pulse Resp SpO2  05/10/12 2339 - 100.2 F (37.9 C) - - - -  05/10/12 2249 118/74 mmHg - - 122 20 98 %  05/10/12 2145 138/69 mmHg - - 120 - 98 %  05/10/12 2143 - 99.2 F (37.3 C) - - - -  05/10/12 2130 121/71 mmHg - - 116 - 98 %  05/10/12 2115 110/78 mmHg - - 115 - 98 %  05/10/12 2100 115/72 mmHg - - 119 - 98 %  05/10/12 2049 - 101.6 F (38.7 C) - - - -  05/10/12 2045 98/51 mmHg - - 121 - 98 %  05/10/12 1942 - 102.8 F (39.3 C) Oral - - -  05/10/12 1835 83/42 mmHg 100.3 F (37.9 C) - 123 20 98 %  05/10/12 0234 - 100.2 F (37.9 C) Oral - - -    1. General:  in No Acute distress 2. Psychological: Alert and Oriented 3. Head/ENT:   Moist   Mucous Membranes                          Head Non traumatic, neck supple                          Normal  Dentition 4. SKIN:  decreased Skin turgor,  Skin clean Dry and intact no rash, flushed 5. Heart: rapid but Regular rate and rhythm no Murmur, Rub or gallop 6. Lungs:  no wheezes occasional crackles   7. Abdomen: Soft, non-tender, Non distended 8. Lower extremities: no clubbing, cyanosis, or edema 9. Neurologically Grossly intact, moving all 4 extremities equally 10. MSK: Normal range of motion  body mass index is unknown because there is no weight on file.   Labs on Admission:   Recent Labs  05/10/12 1921  NA 132*  K 3.4*  CL 99  CO2 22  GLUCOSE 108*  BUN 12  CREATININE 1.16  CALCIUM 9.8   No results found for this basename: AST, ALT, ALKPHOS, BILITOT, PROT, ALBUMIN,  in the last 72 hours No results found for this basename: LIPASE, AMYLASE,  in the last 72 hours  Recent Labs  05/10/12 1921  WBC 26.6*  NEUTROABS 24.5*  HGB 15.2  HCT 42.3  MCV 86.0  PLT 218   No results found for this basename: CKTOTAL, CKMB,  CKMBINDEX, TROPONINI,  in the last 72 hours No results found for this basename: TSH, T4TOTAL, FREET3, T3FREE, THYROIDAB,  in the last 72 hours No results found for this basename: VITAMINB12, FOLATE, FERRITIN, TIBC, IRON, RETICCTPCT,  in the last 72 hours No results found for this basename: HGBA1C    The CrCl is unknown because both  a height and weight (above a minimum accepted value) are required for this calculation. ABG No results found for this basename: phart, pco2, po2, hco3, tco2, acidbasedef, o2sat     No results found for this basename: DDIMER    Cultures: No results found for this basename: sdes, specrequest, cult, reptstatus       Radiological Exams on Admission: Dg Chest Portable 1 View  05/10/2012  *RADIOLOGY REPORT*  Clinical Data: Chest pain and shortness of breath.  Cough.  Fever.  PORTABLE CHEST - 1 VIEW  Comparison: None.  Findings: The heart size is normal.  Lungs are clear.  The visualized soft tissues and bony thorax are unremarkable.  IMPRESSION: Negative one-view chest.   Original Report Authenticated By: Marin Roberts, M.D.   Film has been reviewed by me and with PCCM and there is a possibility of RLL PNA.  Chart has been reviewed  Assessment/Plan  32 yo W sepsis due to PNA  Present on Admission:  . CAP (community acquired pneumonia) -  will admit for treatment of CAP to Stepdown will start on appropriate antibiotic coverage. Rocephin, Zithromax and vanc   Obtain sputum cultures, blood cultures, repeat lactic acid,order procalcitonin Provide oxygen as needed. PCCM is aware and will follow with [place].  . Hemoptysis - vs rusty colored sputum likely due to PNA, suspect Strep pneumo . Chest pain on breathing - likely due to PNA, PE less likely given infiltrate, fever and elevated WBC . Asthma, persistent - xopenex PRN, currently no wheezing . Hypotension - improved with IVF will continue with aggressive IVF . Sepsis - IVF, IV antibiotics, monitor in stepdown,  PCCM aware and are consulting   Prophylaxis:  Lovenox, Protonix  CODE STATUS: FULL CODE  Other plan as per orders.  I have spent a total of 55 min on this admission  Eulah Walkup 05/11/2012, 12:12 AM

## 2012-05-11 NOTE — Consult Note (Signed)
PULMONARY  / CRITICAL CARE MEDICINE  Name: Matthew Petty MRN: [id number] DOB: 1980/07/06    ADMISSION DATE:  05/10/2012 CONSULTATION DATE:  05/11/2012  REFERRING MD :  Adela Glimpse PRIMARY SERVICE: TRH  CHIEF COMPLAINT:  Cough, dyspnea  BRIEF PATIENT DESCRIPTION: 32 y/o male with Asthma presented on 4/9 to the [facility] ED with CAP.  SIGNIFICANT EVENTS / STUDIES:    LINES / TUBES:   CULTURES: 4/9 blood >> 4/9 urine >> 4/9 ur strep >> 4/9 urine leg >>  ANTIBIOTICS: 4/9 ceftriaxone >> 4/9 azithro >>  HISTORY OF PRESENT ILLNESS:  32 y/o male with Asthma presented on 4/9 to the [facility] ED with fever, chills, cough productive of purulent, bloody sputum, and R chest pain.  His son had been sick with similar symptoms in the last few days.  The patient's symptoms started on 4/9 abruptly.  He denied sinus symptoms or other complaints.  PAST MEDICAL HISTORY :  Past Medical History  Diagnosis Date  . Asthma   . Allergic rhinitis     ragweed  . Herpes simplex     genital, recurrent  . Anxiety     Failed trial of wellbutrin in the past  . 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 Elevated blood pressure reading without diagnosis of hypertension   . Insomnia 10/21/2010   History reviewed. No pertinent past surgical history. Prior to Admission medications   Medication Sig Start Date End Date Taking? Authorizing Provider  albuterol (PROAIR HFA) 108 (90 BASE) MCG/ACT inhaler Inhale 2 puffs into the lungs every 6 (six) hours as needed for wheezing. 04/17/12  Yes Jeoffrey Massed, MD  cloNIDine (CATAPRES) 0.1 MG tablet Take 0.2 mg by mouth at bedtime.   Yes Historical Provider, MD  fexofenadine (ALLEGRA) 180 MG tablet Take 1 tablet (180 mg total) by mouth daily. 04/17/12  Yes Jeoffrey Massed, MD  Fluticasone-Salmeterol (ADVAIR DISKUS) 100-50 MCG/DOSE AEPB Inhale 1 puff into the lungs every 12 (twelve) hours. 04/17/12  Yes Jeoffrey Massed, MD  ibuprofen (ADVIL,MOTRIN) 200 MG tablet  Take 200-400 mg by mouth every 6 (six) hours as needed for pain, fever or headache.   Yes Historical Provider, MD  triamcinolone (NASACORT) 55 MCG/ACT nasal inhaler Place 2 sprays into the nose daily.   Yes Historical Provider, MD  acyclovir (ZOVIRAX) 400 MG tablet Take 1 tablet (400 mg total) by mouth 2 (two) times daily. 04/17/12   Jeoffrey Massed, MD   No Known Allergies  FAMILY HISTORY:  Family History  Problem Relation Age of Onset  . Cancer Mother     Thyroid: cured (age 17)  . Cancer Paternal Grandfather     Prostate cancer in his 53s  . Heart disease Maternal Grandfather   . Mental illness Maternal Grandfather     bipolar?   SOCIAL HISTORY:  reports that he has never smoked. He has never used smokeless tobacco. He reports that  drinks alcohol. He reports that he does not use illicit drugs.  REVIEW OF SYSTEMS:   Gen: + fever, chills, denies weight change, fatigue, night sweats HEENT: Denies blurred vision, double vision, hearing loss, tinnitus, sinus congestion, rhinorrhea, sore throat, neck stiffness, dysphagia PULM: per HPI CV: Denies chest pain, edema, orthopnea, paroxysmal nocturnal dyspnea, palpitations GI: Denies abdominal pain, nausea, vomiting, diarrhea, hematochezia, melena, constipation, change in bowel habits GU: Denies dysuria, hematuria, polyuria, oliguria, urethral discharge Endocrine: Denies hot or cold intolerance, polyuria, polyphagia or appetite change  Derm: Denies rash, dry skin, scaling or peeling skin change Heme: Denies easy bruising, bleeding, bleeding gums Neuro: Denies headache, numbness, weakness, slurred speech, loss of memory or consciousness   SUBJECTIVE:   VITAL SIGNS: Temp:  [99.2 F (37.3 C)-102.8 F (39.3 C)] 100.2 F (37.9 C) (04/09 2339) Pulse Rate:  [115-123] 122 (04/09 2249) Resp:  [20] 20 (04/09 2249) BP: (83-138)/(42-78) 118/74 mmHg (04/09 2249) SpO2:  [98 %] 98 % (04/09 2249) HEMODYNAMICS:   VENTILATOR SETTINGS:   INTAKE /  OUTPUT: Intake/Output   None     PHYSICAL EXAMINATION:  Gen: well appearing, no acute distress Eyes: EOMi, PERRL, sclera anicteric HENT: NCAT, OP clear, neck supple without masses PULM: Few insp crackles R anterior; otherwise CTA B CV: RRR, no mgr, no JVD AB: BS+, soft, nontender, no hsm Ext: warm, no edema, no clubbing, no cyanosis Derm: no rash or skin breakdown Neuro: A&Ox4, CN II-XII intact, strength 5/5 in all 4 extremities Psyche: norma  LABS:  Recent Labs Lab 05/10/12 1921  HGB 15.2  WBC 26.6*  PLT 218  NA 132*  K 3.4*  CL 99  CO2 22  GLUCOSE 108*  BUN 12  CREATININE 1.16  CALCIUM 9.8  LATICACIDVEN 2.0   No results found for this basename: GLUCAP,  in the last 168 hours  4/9 CXR: ?RML infiltrate? Lungs otherwise clear  ASSESSMENT / PLAN:  PULMONARY A: CAP > based on fever, chills, productive cough, and what looks like a RML infiltrate; ddx includes viral tracheobronchitis; "rusty sputum" characteristic of strep pneumonia;   Asthma> not in exacerbation  P:   -ceftriaxone/azithro -f/u cultures -add urine strep biomarker -add sputum culture -O2 as needed -prn bronchodilators  CARDIOVASCULAR A: Mild hypotension resolved with IVF P:  -continue IVF  RENAL: A: Hypokalemia P: -replete   TODAY'S SUMMARY: 32 y/o male with CAP, not-severe.  Agree with admit to SDU, then hopefully d/c home in 48 hours.  Fonnie Jarvis [facility] Pager: [phone]  05/11/2012, 1:06 AM

## 2012-05-11 NOTE — Discharge Summary (Signed)
Physician Discharge Summary  Matthew Petty OZH:[id number] DOB: [birth year] DOA: 05/10/2012  PCP: Jeoffrey Massed, MD  Admit date: 05/10/2012 Discharge date: 05/11/2012  Time spent: >45 minutes  Recommendations for Outpatient Follow-up:  1. F/u with Dr Marchelle Gearing in 1 wk  Discharge Diagnoses:  Active Problems:   Asthma, persistent   CAP (community acquired pneumonia)   Hemoptysis   Chest pain on breathing   Hypotension   Sepsis   Discharge Condition: stable  Diet recommendation: heart healthy  Filed Weights   05/11/12 0300  Weight: 82.6 kg (182 lb 1.6 oz)    History of present illness:  Matthew Petty is a 32 y.o. male who has a past medical history of Asthma; Allergic rhinitis; Herpes simplex; Anxiety; Positive PPD; Elevated blood pressure reading without diagnosis of hypertension; and Insomnia (10/21/2010).  Presented with 2 weeks of cough but for the past 5 days it has gotten worse and for the past 1 day he started to have hemoptysis, fever up 104, and pleuritic chest pain. He inititaly came to urgent care and was told to come to ER- CXR was worrisome for PNA but repeat portable has been negative. He denies any leg swelling. Over the weekend he flew to [place]. He have had some nausea but no vomiting and no diarrhea. He reports some shortness of breath as well.  His son who is 23 months have had a cough and a fever.    Hospital Course:  CAP/Sepsis RML pneumonia is suspected - he has developed a right lung collapse overnight-pulmonary consult was requested and pt admitted to Step down unit- fortunately despite the lung collapse noted on CXR, he has recovered quite rapidly and is now stable to be discharged home- O2 sat is 98% on room air- WBC counts have improved from 26.6 to 19.3- Dr Marchelle Gearing (pulmonary) recommends an outpt follow up in 3 week for a CXR- he has been transitioned to Avelox x total 10 days- He is advised to use incentive spirometry Q 2hrs, Mucinex and maintain  adequate hydration to continue to mobilize mucous.    Consultations:  PCCM  Discharge Exam: Filed Vitals:   05/11/12 0315 05/11/12 0800 05/11/12 1201 05/11/12 1209  BP: 116/66 124/77  125/84  Pulse:  108  108  Temp: 98.5 F (36.9 C) 98.1 F (36.7 C)  98.3 F (36.8 C)  TempSrc: Oral Oral  Oral  Resp: 27 31  25   Height:      Weight:      SpO2: 95% 98% 97% 98%    General: alert oriented x 3, no distress Cardiovascular: RRR, no murmurs Respiratory: Crackles at right lung base, no wheeze or ronchi  Discharge Instructions      Discharge Orders   Future Appointments Provider Department Dept Phone   06/01/2012 3:00 PM Kalman Shan, MD [facility] [phone]   Future Orders Complete By Expires     Diet - low sodium heart healthy  As directed     Discharge instructions  As directed     Comments:      Continue using incentive spirometry. Use Mucinex to help loosen up cough.    Increase activity slowly  As directed         Medication List    TAKE these medications       acyclovir 400 MG tablet  Commonly known as:  ZOVIRAX  Take 1 tablet (400 mg total) by mouth 2 (two) times daily.     albuterol 108 (90 BASE) MCG/ACT  inhaler  Commonly known as:  PROAIR HFA  Inhale 2 puffs into the lungs every 6 (six) hours as needed for wheezing.     cloNIDine 0.1 MG tablet  Commonly known as:  CATAPRES  Take 0.2 mg by mouth at bedtime.     fexofenadine 180 MG tablet  Commonly known as:  ALLEGRA  Take 1 tablet (180 mg total) by mouth daily.     Fluticasone-Salmeterol 100-50 MCG/DOSE Aepb  Commonly known as:  ADVAIR DISKUS  Inhale 1 puff into the lungs every 12 (twelve) hours.     ibuprofen 200 MG tablet  Commonly known as:  ADVIL,MOTRIN  Take 200-400 mg by mouth every 6 (six) hours as needed for pain, fever or headache.     moxifloxacin 400 MG tablet  Commonly known as:  AVELOX  Take 1 tablet (400 mg total) by mouth daily.     triamcinolone 55 MCG/ACT  nasal inhaler  Commonly known as:  NASACORT  Place 2 sprays into the nose daily.       Follow-up Information   Follow up with MCGOWEN,PHILIP H, MD. Schedule an appointment as soon as possible for a visit in 1 week.   Contact information:   [address] [place] [address] [phone]       Follow up with [facility], MD. (06/01/12 at 3 PM)    Contact information:   [address] [place] [address] [phone]        The results of significant diagnostics from this hospitalization (including imaging, microbiology, ancillary and laboratory) are listed below for reference.    Significant Diagnostic Studies: Dg Chest 2 View  05/11/2012  *RADIOLOGY REPORT*  Clinical Data: Evaluate pneumonia  CHEST - 2 VIEW  Comparison: Prior chest x-ray 05/10/2012  Findings: Interval development of opacity and volume loss in the right middle lobe most consistent with right middle lobe collapse. Streaky opacities in the bilateral bases may reflect additional areas of subsegmental atelectasis.  Cardiac and mediastinal contours remain within normal limits.  No pleural effusion or pneumothorax.  IMPRESSION:  1.  Interval development of right middle lobe collapse. Superimposed infiltrate is difficult to exclude.  Recommend repeat chest x-ray after appropriate therapy.  2.  Additional streaky bibasilar opacities also may reflect atelectasis or additional areas of infiltrate.   Original Report Authenticated By: Malachy Moan, M.D.    Dg Chest Portable 1 View  05/10/2012  *RADIOLOGY REPORT*  Clinical Data: Chest pain and shortness of breath.  Cough.  Fever.  PORTABLE CHEST - 1 VIEW  Comparison: None.  Findings: The heart size is normal.  Lungs are clear.  The visualized soft tissues and bony thorax are unremarkable.  IMPRESSION: Negative one-view chest.   Original Report Authenticated By: Marin Roberts, M.D.     Microbiology: Recent Results (from the past 240 hour(s))  CULTURE,  EXPECTORATED SPUTUM-ASSESSMENT     Status: None   Collection Time    05/11/12  2:31 AM      Result Value Range Status   Specimen Description SPUTUM   Final   Special Requests Normal   Final   Sputum evaluation     Final   Value: THIS SPECIMEN IS ACCEPTABLE. RESPIRATORY CULTURE REPORT TO FOLLOW.   Report Status 05/11/2012 FINAL   Final  CULTURE, RESPIRATORY (NON-EXPECTORATED)     Status: None   Collection Time    05/11/12  2:31 AM      Result Value Range Status   Specimen Description SPUTUM  Final   Special Requests NONE   Final   Gram Stain     Final   Value: ABUNDANT WBC PRESENT, PREDOMINANTLY PMN     RARE SQUAMOUS EPITHELIAL CELLS PRESENT     RARE GRAM POSITIVE COCCI     IN PAIRS RARE GRAM NEGATIVE RODS     RARE GRAM NEGATIVE COCCI   Culture PENDING   Incomplete   Report Status PENDING   Incomplete  MRSA PCR SCREENING     Status: None   Collection Time    05/11/12  4:36 AM      Result Value Range Status   MRSA by PCR NEGATIVE  NEGATIVE Final   Comment:            The GeneXpert MRSA Assay (FDA     approved for NASAL specimens     only), is one component of a     comprehensive MRSA colonization     surveillance program. It is not     intended to diagnose MRSA     infection nor to guide or     monitor treatment for     MRSA infections.     Labs: Basic Metabolic Panel:  Recent Labs Lab 05/10/12 1921 05/11/12 0514  NA 132* 138  K 3.4* 3.5  CL 99 107  CO2 22 23  GLUCOSE 108* 137*  BUN 12 8  CREATININE 1.16 1.02  CALCIUM 9.8 8.4   Liver Function Tests:  Recent Labs Lab 05/11/12 0048 05/11/12 0514  AST 13 12  ALT 14 12  ALKPHOS 46 45  BILITOT 0.6 0.6  PROT 6.0 6.0  ALBUMIN 3.3* 3.2*   No results found for this basename: LIPASE, AMYLASE,  in the last 168 hours No results found for this basename: AMMONIA,  in the last 168 hours CBC:  Recent Labs Lab 05/10/12 1921 05/11/12 0514  WBC 26.6* 19.3*  NEUTROABS 24.5* 17.1*  HGB 15.2 13.5  HCT 42.3  38.3*  MCV 86.0 87.2  PLT 218 178   Cardiac Enzymes: No results found for this basename: CKTOTAL, CKMB, CKMBINDEX, TROPONINI,  in the last 168 hours BNP: BNP (last 3 results) No results found for this basename: PROBNP,  in the last 8760 hours CBG: No results found for this basename: GLUCAP,  in the last 168 hours     Signed:  Christle Nolting  [facility] 05/11/2012, 1:28 PM

## 2012-05-11 NOTE — Progress Notes (Signed)
Patient walked 2 laps around unit, sats stayed in the mid 90"s to 98% on room air. MD aware. Will continue to monitor.

## 2012-05-11 NOTE — Progress Notes (Signed)
ANTIBIOTIC CONSULT NOTE - INITIAL  Pharmacy Consult for vancomycin Indication: rule out pneumonia  No Known Allergies  Patient Measurements: Height: 5' 6.53" (169 cm) Weight: 182 lb 1.6 oz (82.6 kg) IBW/kg (Calculated) : 65.03  Vital Signs: Temp: 99.2 F (37.3 C) (04/10 0110) Temp src: Oral (04/10 0110) BP: 122/57 mmHg (04/10 0200) Pulse Rate: 115 (04/10 0215)  Labs:  Recent Labs  05/10/12 1921  WBC 26.6*  HGB 15.2  PLT 218  CREATININE 1.16   Estimated Creatinine Clearance: 94.8 ml/min (by C-G formula based on Cr of 1.16). No results found for this basename: VANCOTROUGH, VANCOPEAK, VANCORANDOM, GENTTROUGH, GENTPEAK, GENTRANDOM, TOBRATROUGH, TOBRAPEAK, TOBRARND, AMIKACINPEAK, AMIKACINTROU, AMIKACIN,  in the last 72 hours   Microbiology: No results found for this or any previous visit (from the past 720 hour(s)).  Medical History: Past Medical History  Diagnosis Date  . Asthma   . Allergic rhinitis     ragweed  . Herpes simplex     genital, recurrent  . Anxiety     Failed trial of wellbutrin in the past  . 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 Elevated blood pressure reading without diagnosis of hypertension   . Insomnia 10/21/2010    Medications:  Prescriptions prior to admission  Medication Sig Dispense Refill  . albuterol (PROAIR HFA) 108 (90 BASE) MCG/ACT inhaler Inhale 2 puffs into the lungs every 6 (six) hours as needed for wheezing.  1 Inhaler  1  . cloNIDine (CATAPRES) 0.1 MG tablet Take 0.2 mg by mouth at bedtime.      . fexofenadine (ALLEGRA) 180 MG tablet Take 1 tablet (180 mg total) by mouth daily.  30 tablet  6  . Fluticasone-Salmeterol (ADVAIR DISKUS) 100-50 MCG/DOSE AEPB Inhale 1 puff into the lungs every 12 (twelve) hours.  60 each  6  . ibuprofen (ADVIL,MOTRIN) 200 MG tablet Take 200-400 mg by mouth every 6 (six) hours as needed for pain, fever or headache.      . triamcinolone (NASACORT) 55 MCG/ACT nasal inhaler Place 2  sprays into the nose daily.      Marland Kitchen acyclovir (ZOVIRAX) 400 MG tablet Take 1 tablet (400 mg total) by mouth 2 (two) times daily.  60 tablet  6   Scheduled:  . [COMPLETED] acetaminophen  650 mg Oral Once  . [COMPLETED] azithromycin  500 mg Intravenous Once  . [START ON 05/12/2012] azithromycin  500 mg Intravenous Q24H  . [COMPLETED] cefTRIAXone (ROCEPHIN)  IV  1 g Intravenous Once  . cefTRIAXone (ROCEPHIN)  IV  1 g Intravenous Q24H  . enoxaparin (LOVENOX) injection  40 mg Subcutaneous Q24H  . [COMPLETED] ibuprofen  800 mg Oral Once  . mometasone-formoterol  2 puff Inhalation BID  . [COMPLETED]  morphine injection  2 mg Intravenous Once  . pantoprazole  40 mg Oral Q1200  . [COMPLETED] potassium chloride  20 mEq Oral Once  . [COMPLETED] sodium chloride  1,000 mL Intravenous Once  . [COMPLETED] sodium chloride  1,000 mL Intravenous Once  . [COMPLETED] sodium chloride  1,000 mL Intravenous Once  . vancomycin  750 mg Intravenous Q8H   Infusions:  . sodium chloride      Assessment: 31yo male c/o fever, chills, CP, and cough productive of purulent bloody sputum with abrupt onset, to begin IV ABX for PNA.   Goal of Therapy:  Vancomycin trough level 15-20 mcg/ml  Plan:  Will begin vancomycin 1g IV Q8H and monitor CBC, Cx, levels prn.  Vernard Gambles, PharmD, BCPS  05/11/2012,3:07 AM

## 2012-05-11 NOTE — Progress Notes (Signed)
Utilization review completed.  

## 2012-05-11 NOTE — Consult Note (Signed)
Quick bedside round on the patient  - He is clinically looking better. Chest x-ray shows new right middle lobe collapse. Urine pneumococcal antigen is positive. He is wanting to go home  - Order repeat lactate and pro-calcitonin to help the hospitalist decide on appropriate timing of discharge  -  incentive spirometry for right middle lobe collapse  - Needs pulmonary followup in 2-3 weeks with a chest x-ray; 06/01/12 at 3pm DR Seward [facility]  - pccm will sign off   D./w rizwan  Dr. Kalman Shan, M.D., [facility]C.P Pulmonary and Critical Care Medicine Staff Physician [facility] Pager: [phone], If no answer or between  15:00h - 7:00h: call [phone]  05/11/2012 12:25 PM

## 2012-05-12 LAB — URINE CULTURE: Special Requests: NORMAL

## 2012-05-13 ENCOUNTER — Encounter: Payer: Self-pay | Admitting: Family Medicine

## 2012-05-13 LAB — CULTURE, RESPIRATORY W GRAM STAIN: Culture: NORMAL

## 2012-05-17 LAB — CULTURE, BLOOD (ROUTINE X 2): Culture: NO GROWTH

## 2012-06-01 ENCOUNTER — Inpatient Hospital Stay: Payer: BC Managed Care – PPO | Admitting: Internal Medicine

## 2012-06-13 ENCOUNTER — Ambulatory Visit (INDEPENDENT_AMBULATORY_CARE_PROVIDER_SITE_OTHER): Payer: BC Managed Care – PPO | Admitting: Internal Medicine

## 2012-06-13 ENCOUNTER — Encounter: Payer: Self-pay | Admitting: Internal Medicine

## 2012-06-13 ENCOUNTER — Ambulatory Visit (INDEPENDENT_AMBULATORY_CARE_PROVIDER_SITE_OTHER)
Admission: RE | Admit: 2012-06-13 | Discharge: 2012-06-13 | Disposition: A | Discharge status: Home / Self Care | Payer: BC Managed Care – PPO | Source: Ambulatory Visit | Attending: Internal Medicine | Admitting: Internal Medicine

## 2012-06-13 VITALS — BP 118/72 | HR 99 | Ht 66.0 in | Wt 181.6 lb

## 2012-06-13 DIAGNOSIS — J189 Pneumonia, unspecified organism: Secondary | ICD-10-CM

## 2012-06-13 DIAGNOSIS — J45909 Unspecified asthma, uncomplicated: Secondary | ICD-10-CM

## 2012-06-13 DIAGNOSIS — IMO0002 Reserved for concepts with insufficient information to code with codable children: Secondary | ICD-10-CM

## 2012-06-13 DIAGNOSIS — J690 Pneumonitis due to inhalation of food and vomit: Secondary | ICD-10-CM

## 2012-06-13 NOTE — Patient Instructions (Signed)
Breathing test is normal and cXR Righht middle collapse is significantly better REsidual cough likely post infectious reactive but ok to increase advair to 250/50 dose 1 puff twice daily USe albuterol as needed Followup with chest xray with pulmonary in 1-2 months in [place]; might need repeat breathing test there Gradually expect your fatigue and symptoms to improve next 1-2 months; if not consult a physician

## 2012-06-13 NOTE — Progress Notes (Signed)
Subjective:    Patient ID: Matthew Petty, male    DOB: 11-25-1980, 32 y.o.   MRN: [id number]  HPI  Office visit 06/13/2012:  32 year old [place] male with a background history of asthma. Pulmonary office for followup. At baseline he is had childhood asthma. He has used Advair on and off. Most recently used Advair in May 2013 during the allergy season. After that was only using albuterol rarely as needed but I suspect he was using it at least once a day in the 2 weeks leading up to admission for coronary couldn't pneumonia never thousand 14.  Was admitted 05/10/2012 through 05/11/2012 for right-sided community-acquired pneumonia due to Streptococcus pneumoniae. He presented with hemoptysis, sepsis syndrome the pro-CAL  4.6 but a normal lactate. He did require oxygen therapy temporarily. But was quickly weaned off.  Today 06/13/2012 he presents to pulmonary office. His fever has resolved. His hemoptysis is resolved. However he still having mild chronic cough that is dry and is extremely deconditioned. He is unable to do activities of daily living without feeling exhausted by and the day. In addition is having shortness of breath. He is taking his Advair but despite this feels he is plateaued off his improvement in the last 2 weeks. His other complaints  Of his spirometry today 06/13/2012 is essentially normal. FEV1 4.1 L/103%. FVC 4.9 L/102%. Ratio is 85.  CXR today to me shows improvement but not full reoslution of RML atelectasis  06/13/2012  Dg Chest 2 View  06/13/2012  *RADIOLOGY REPORT*  Clinical Data: Pneumonia, follow-up, shortness of breath  CHEST - 2 VIEW  Comparison: 05/11/2012  Findings: Normal heart size, mediastinal contours, and pulmonary vascularity. Lungs clear. Previously seen right basilar infiltrate resolved. No pleural effusion or pneumothorax. Bones unremarkable.  IMPRESSION: Normal chest. Resolution of previously identified right basilar infiltrate.   Original Report  Authenticated By: Ulyses Southward, M.D.        Past, Family, Social reviewed: no change since last visit   Review of Systems  Constitutional: Negative for fever and unexpected weight change.  HENT: Negative for ear pain, nosebleeds, congestion, sore throat, rhinorrhea, sneezing, trouble swallowing, dental problem, postnasal drip and sinus pressure.   Eyes: Negative for redness and itching.  Respiratory: Positive for cough, chest tightness and shortness of breath. Negative for wheezing.   Cardiovascular: Negative for palpitations and leg swelling.  Gastrointestinal: Negative for nausea and vomiting.  Genitourinary: Negative for dysuria.  Musculoskeletal: Negative for joint swelling.  Skin: Negative for rash.  Neurological: Negative for headaches.  Hematological: Does not bruise/bleed easily.  Psychiatric/Behavioral: Negative for dysphoric mood. The patient is not nervous/anxious.        Objective:   Physical Exam  Nursing note and vitals reviewed. Constitutional: He is oriented to person, place, and time. He appears well-developed and well-nourished. No distress.  Obese, sitting in wheel chair, talkative today, nasal cannula on  HENT:  Head: Normocephalic and atraumatic.  Right Ear: External ear normal.  Left Ear: External ear normal.  Mouth/Throat: Oropharynx is clear and moist. No oropharyngeal exudate.  Eyes: Conjunctivae and EOM are normal. Pupils are equal, round, and reactive to light. Right eye exhibits no discharge. Left eye exhibits no discharge. No scleral icterus.  Neck: Normal range of motion. Neck supple. No JVD present. No tracheal deviation present. No thyromegaly present.  Cardiovascular: Normal rate, regular rhythm and intact distal pulses.  Exam reveals no gallop and no friction rub.   No murmur heard. Pulmonary/Chest: Effort normal and breath sounds  normal. No respiratory distress. He has no wheezes. He has no rales. He exhibits no tenderness.  Abdominal: Soft.  Bowel sounds are normal. He exhibits no distension and no mass. There is no tenderness. There is no rebound and no guarding.  Musculoskeletal: Normal range of motion. He exhibits edema. He exhibits no tenderness.  ++ edema  Lymphadenopathy:    He has no cervical adenopathy.  Neurological: He is alert and oriented to person, place, and time. He has normal reflexes. No cranial nerve deficit. Coordination normal.  Skin: Skin is warm and dry. No rash noted. He is not diaphoretic. No erythema. No pallor.  Psychiatric: He has a normal mood and affect. His behavior is normal. Judgment and thought content normal.          Assessment & Plan:

## 2012-06-14 NOTE — Assessment & Plan Note (Signed)
Spirometry is normal while on Advair 100/50 one puff twice a day. However he is significant cough which could just be post infectious reactive cough. However he is concerned that this might reflect active asthma therefore I will increase his Advair 250/50 one puff twice a day. I've asked him to register with a pulmonologist in [place] in the next one to 2 months and have a repeat spirometry done

## 2012-06-14 NOTE — Assessment & Plan Note (Signed)
Clinically CAP from pneumococcus resolved. HE hsa post pneumonia fatigue. CXR per radiologist has normalized but I think he has right middle lobe atelectasis. I recommended a follow chest x-ray in one to 2 months when he moved to [place]

## 2013-09-18 ENCOUNTER — Ambulatory Visit (INDEPENDENT_AMBULATORY_CARE_PROVIDER_SITE_OTHER): Payer: 59 | Admitting: Family Medicine

## 2013-09-18 ENCOUNTER — Encounter: Payer: Self-pay | Admitting: Family Medicine

## 2013-09-18 VITALS — BP 134/93 | HR 82 | Temp 98.3°F | Resp 18 | Ht 66.5 in | Wt 186.0 lb

## 2013-09-18 DIAGNOSIS — I10 Essential (primary) hypertension: Secondary | ICD-10-CM

## 2013-09-18 DIAGNOSIS — Z0000 Encounter for general adult medical examination without abnormal findings: Secondary | ICD-10-CM | POA: Insufficient documentation

## 2013-09-18 DIAGNOSIS — IMO0002 Reserved for concepts with insufficient information to code with codable children: Secondary | ICD-10-CM

## 2013-09-18 DIAGNOSIS — J45909 Unspecified asthma, uncomplicated: Secondary | ICD-10-CM

## 2013-09-18 DIAGNOSIS — J309 Allergic rhinitis, unspecified: Secondary | ICD-10-CM

## 2013-09-18 LAB — CBC WITH DIFFERENTIAL/PLATELET
BASOS ABS: 0 10*3/uL (ref 0.0–0.1)
Basophils Relative: 0.6 % (ref 0.0–3.0)
Eosinophils Absolute: 0.4 10*3/uL (ref 0.0–0.7)
Eosinophils Relative: 5 % (ref 0.0–5.0)
HEMATOCRIT: 48.6 % (ref 39.0–52.0)
HEMOGLOBIN: 16.5 g/dL (ref 13.0–17.0)
LYMPHS ABS: 2.3 10*3/uL (ref 0.7–4.0)
Lymphocytes Relative: 30 % (ref 12.0–46.0)
MCHC: 34 g/dL (ref 30.0–36.0)
MCV: 92.2 fl (ref 78.0–100.0)
MONO ABS: 0.5 10*3/uL (ref 0.1–1.0)
MONOS PCT: 6.1 % (ref 3.0–12.0)
NEUTROS ABS: 4.5 10*3/uL (ref 1.4–7.7)
Neutrophils Relative %: 58.3 % (ref 43.0–77.0)
Platelets: 242 10*3/uL (ref 150.0–400.0)
RBC: 5.27 Mil/uL (ref 4.22–5.81)
RDW: 12.6 % (ref 11.5–15.5)
WBC: 7.7 10*3/uL (ref 4.0–10.5)

## 2013-09-18 LAB — COMPREHENSIVE METABOLIC PANEL
ALK PHOS: 44 U/L (ref 39–117)
ALT: 21 U/L (ref 0–53)
AST: 22 U/L (ref 0–37)
Albumin: 4.5 g/dL (ref 3.5–5.2)
BILIRUBIN TOTAL: 1 mg/dL (ref 0.2–1.2)
BUN: 12 mg/dL (ref 6–23)
CO2: 25 meq/L (ref 19–32)
CREATININE: 0.9 mg/dL (ref 0.4–1.5)
Calcium: 9.8 mg/dL (ref 8.4–10.5)
Chloride: 106 mEq/L (ref 96–112)
GFR: 98.95 mL/min (ref >60.00)
Glucose, Bld: 76 mg/dL (ref 70–99)
Potassium: 4.2 mEq/L (ref 3.5–5.1)
Sodium: 138 mEq/L (ref 135–145)
Total Protein: 7.6 g/dL (ref 6.0–8.3)

## 2013-09-18 LAB — LIPID PANEL
CHOLESTEROL: 204 mg/dL — AB (ref 0–200)
HDL: 46.5 mg/dL (ref >39.00)
LDL CALC: 139 mg/dL — AB (ref 0–99)
NONHDL: 157.5
Total CHOL/HDL Ratio: 4
Triglycerides: 92 mg/dL (ref 0.0–149.0)
VLDL: 18.4 mg/dL (ref 0.0–40.0)

## 2013-09-18 LAB — TSH: TSH: 1.82 u[IU]/mL (ref 0.35–4.50)

## 2013-09-18 NOTE — Progress Notes (Signed)
Pre visit review using our clinic review tool, if applicable. No additional management support is needed unless otherwise documented below in the visit note. 

## 2013-09-18 NOTE — Assessment & Plan Note (Signed)
Stage I diastolic HTN. Discussed TLC--DASH diet handout given, exercise recommendations discussed. Ongoing home bp monitoring discussed. F/u 6 mo to review bp's, start bp med if not avg <140/90 at that time. Signs/symptoms to call or return for were reviewed and pt expressed understanding.

## 2013-09-18 NOTE — Assessment & Plan Note (Signed)
Reviewed age and gender appropriate health maintenance issues (prudent diet, regular exercise, health risks of tobacco and excessive alcohol, use of seatbelts, fire alarms in home, use of sunscreen).  Also reviewed age and gender appropriate health screening as well as vaccine recommendations. HP labs today.  Allergic rhinitis and chronic asthma stable: continue current regimens. Return for flu vaccine after September 1.

## 2013-09-18 NOTE — Progress Notes (Signed)
Office Note 09/18/2013  CC:  Chief Complaint  Patient presents with  . Annual Exam    fasting   HPI:  Matthew Petty is a 33 y.o. Latino  male who is here for CPE. Moved to [place] briefly,now is back. Fasting today, desires routine health panel labs. He saw pulmonologist in [place] 02/2013, got spirometry and says it was mildly abnormal, obstructive pattern. He is requiring his albuterol inhaler <2 times per week since being back in [place]. Says allergies in [place] were worse, better since being back in [place].  Of note, due to past hx of elevated bp he monitored his bp regularly for a while in [place] and it was <140 syst and usually 90.   Past Medical History  Diagnosis Date  . Moderate persistent asthma   . Allergic rhinitis     ragweed  . Herpes simplex     genital, recurrent  . Anxiety     Failed trial of wellbutrin in the past  . 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 Elevated blood pressure reading without diagnosis of hypertension   . Insomnia 10/21/2010  . Collapsed lung 05/2012    Collapsed RML in the midst of hospitalization for asthma exacerbation with RML pneumonia (pt to f/u with Dr. Marchelle Gearingamaswamy)    No past surgical history on file.  Family History  Problem Relation Age of Onset  . Cancer Mother     Thyroid: cured (age 32)  . Cancer Paternal Grandfather     Prostate cancer in his 6980s  . Heart disease Maternal Grandfather   . Mental illness Maternal Grandfather     bipolar?    History   Social History  . Marital Status: Married    Spouse Name: Tresa EndoKelly    Number of Children: N/A  . Years of Education: N/A   Occupational History  .     Social History Main Topics  . Smoking status: Never Smoker   . Smokeless tobacco: Never Used  . Alcohol Use: Yes     Comment: weekend  . Drug Use: No  . Sexual Activity: Not on file   Other Topics Concern  . Not on file   Social History Narrative   Married, has one son.   Moved to [place] from  [place] around 2006.   Occupation: Agricultural engineermarketing data scientist.   Has postgrad degree in business/finance.   No exercise.   No tobacco or drugs.  Drinks some alcohol on weekends/socially.    Outpatient Prescriptions Prior to Visit  Medication Sig Dispense Refill  . acyclovir (ZOVIRAX) 400 MG tablet Take 1 tablet (400 mg total) by mouth 2 (two) times daily.  60 tablet  6  . albuterol (PROAIR HFA) 108 (90 BASE) MCG/ACT inhaler Inhale 2 puffs into the lungs every 6 (six) hours as needed for wheezing.  1 Inhaler  1  . cloNIDine (CATAPRES) 0.1 MG tablet Take 0.2 mg by mouth at bedtime.      . fexofenadine (ALLEGRA) 180 MG tablet Take 1 tablet (180 mg total) by mouth daily.  30 tablet  6  . Fluticasone-Salmeterol (ADVAIR DISKUS) 100-50 MCG/DOSE AEPB Inhale 1 puff into the lungs every 12 (twelve) hours.  60 each  6  . ibuprofen (ADVIL,MOTRIN) 200 MG tablet Take 200-400 mg by mouth every 6 (six) hours as needed for pain, fever or headache.      . triamcinolone (NASACORT) 55 MCG/ACT nasal inhaler Place 2 sprays into the nose  daily.       No facility-administered medications prior to visit.    No Known Allergies  ROS Review of Systems  Constitutional: Negative for fever, chills, appetite change and fatigue.  HENT: Negative for congestion, dental problem, ear pain and sore throat.   Eyes: Negative for discharge, redness and visual disturbance.  Respiratory: Negative for cough, chest tightness, shortness of breath and wheezing.   Cardiovascular: Negative for chest pain, palpitations and leg swelling.  Gastrointestinal: Negative for nausea, vomiting, abdominal pain, diarrhea and blood in stool.  Genitourinary: Negative for dysuria, urgency, frequency, hematuria, flank pain and difficulty urinating.  Musculoskeletal: Negative for arthralgias, back pain, joint swelling, myalgias and neck stiffness.  Skin: Negative for pallor and rash.  Neurological: Negative for dizziness, speech difficulty,  weakness and headaches.  Hematological: Negative for adenopathy. Does not bruise/bleed easily.  Psychiatric/Behavioral: Negative for confusion and sleep disturbance. The patient is not nervous/anxious.     PE; Blood pressure 134/93, pulse 82, temperature 98.3 F (36.8 C), temperature source Temporal, resp. rate 18, height 5' 6.5" (1.689 m), weight 186 lb (84.369 kg), SpO2 96.00%. Gen: Alert, well appearing.  Patient is oriented to person, place, time, and situation. AFFECT: pleasant, lucid thought and speech. ENT: Ears: EACs clear, normal epithelium.  TMs with good light reflex and landmarks bilaterally.  Eyes: no injection, icteris, swelling, or exudate.  EOMI, PERRLA. Nose: no drainage or turbinate edema/swelling.  No injection or focal lesion.  Mouth: lips without lesion/swelling.  Oral mucosa pink and moist.  Dentition intact and without obvious caries or gingival swelling.  Oropharynx without erythema, exudate, or swelling.  Neck: supple/nontender.  No LAD, mass, or TM.  Carotid pulses 2+ bilaterally, without bruits. CV: RRR, no m/r/g.   LUNGS: CTA bilat, nonlabored resps, good aeration in all lung fields. ABD: soft, NT, ND, BS normal.  No hepatospenomegaly or mass.  No bruits. EXT: no clubbing, cyanosis, or edema.  Musculoskeletal: no joint swelling, erythema, warmth, or tenderness.  ROM of all joints intact. Skin - no sores or suspicious lesions or rashes or color changes  Pertinent labs:  None today  ASSESSMENT AND PLAN:   HTN (hypertension), benign Stage I diastolic HTN. Discussed TLC--DASH diet handout given, exercise recommendations discussed. Ongoing home bp monitoring discussed. F/u 6 mo to review bp's, start bp med if not avg <140/90 at that time. Signs/symptoms to call or return for were reviewed and pt expressed understanding.   Health maintenance examination Reviewed age and gender appropriate health maintenance issues (prudent diet, regular exercise, health risks  of tobacco and excessive alcohol, use of seatbelts, fire alarms in home, use of sunscreen).  Also reviewed age and gender appropriate health screening as well as vaccine recommendations. HP labs today.  Allergic rhinitis and chronic asthma stable: continue current regimens. Return for flu vaccine after September 1.  An After Visit Summary was printed and given to the patient.  FOLLOW UP:  Return in about 6 months (around 03/21/2014) for f/u HTN.

## 2013-09-19 ENCOUNTER — Other Ambulatory Visit: Payer: Self-pay | Admitting: Family Medicine

## 2013-09-19 ENCOUNTER — Telehealth: Payer: Self-pay | Admitting: Family Medicine

## 2013-09-19 MED ORDER — CLONIDINE HCL 0.1 MG PO TABS
0.2000 mg | ORAL_TABLET | Freq: Every day | ORAL | Status: DC
Start: 2013-09-19 — End: 2013-11-07

## 2013-09-19 NOTE — Telephone Encounter (Signed)
Relevant patient education assigned to patient using Emmi. ° °

## 2013-11-07 ENCOUNTER — Other Ambulatory Visit: Payer: Self-pay | Admitting: Family Medicine

## 2013-11-07 MED ORDER — CLONIDINE HCL 0.1 MG PO TABS: 0.2000 mg | ORAL_TABLET | Freq: Every day | ORAL | Status: DC

## 2014-01-04 ENCOUNTER — Ambulatory Visit (INDEPENDENT_AMBULATORY_CARE_PROVIDER_SITE_OTHER): Payer: 59 | Admitting: Family Medicine

## 2014-01-04 ENCOUNTER — Encounter: Payer: Self-pay | Admitting: Family Medicine

## 2014-01-04 VITALS — BP 125/85 | HR 88 | Temp 98.1°F | Resp 18 | Ht 66.5 in | Wt 184.0 lb

## 2014-01-04 DIAGNOSIS — J069 Acute upper respiratory infection, unspecified: Secondary | ICD-10-CM

## 2014-01-04 DIAGNOSIS — J4541 Moderate persistent asthma with (acute) exacerbation: Secondary | ICD-10-CM

## 2014-01-04 MED ORDER — PREDNISONE 20 MG PO TABS: ORAL_TABLET | ORAL | Status: DC

## 2014-01-04 NOTE — Progress Notes (Signed)
Pre visit review using our clinic review tool, if applicable. No additional management support is needed unless otherwise documented below in the visit note. 

## 2014-01-04 NOTE — Progress Notes (Signed)
OFFICE NOTE  01/04/2014  CC:  Chief Complaint  Patient presents with  . Cough  . Sinusitis   HPI: Patient is a 33 y.o. Latino male who is here for nasal drainage, cough, phlegm.  Not wheezing but used albuterol 3 x in last 2 d, esp hs.  Felt feverish yesterday, took advil.  Has been compliant with advair up until the last 1 wk, then got back on it the last few days. He is moving back to [place] this weekend, [place].  Pertinent PMH:  Past medical, surgical, social, and family history reviewed and no changes are noted since last office visit.  MEDS:  Outpatient Prescriptions Prior to Visit  Medication Sig Dispense Refill  . acyclovir (ZOVIRAX) 400 MG tablet Take 1 tablet (400 mg total) by mouth 2 (two) times daily. 60 tablet 6  . albuterol (PROAIR HFA) 108 (90 BASE) MCG/ACT inhaler Inhale 2 puffs into the lungs every 6 (six) hours as needed for wheezing. 1 Inhaler 1  . cloNIDine (CATAPRES) 0.1 MG tablet Take 2 tablets (0.2 mg total) by mouth at bedtime. 180 tablet 1  . fexofenadine (ALLEGRA) 180 MG tablet Take 1 tablet (180 mg total) by mouth daily. 30 tablet 6  . Fluticasone-Salmeterol (ADVAIR DISKUS) 100-50 MCG/DOSE AEPB Inhale 1 puff into the lungs every 12 (twelve) hours. 60 each 6  . ibuprofen (ADVIL,MOTRIN) 200 MG tablet Take 200-400 mg by mouth every 6 (six) hours as needed for pain, fever or headache.    . triamcinolone (NASACORT) 55 MCG/ACT nasal inhaler Place 2 sprays into the nose daily.     No facility-administered medications prior to visit.    PE: Blood pressure 125/85, pulse 88, temperature 98.1 F (36.7 C), temperature source Oral, resp. rate 18, height 5' 6.5" (1.689 m), weight 184 lb (83.462 kg), SpO2 97 %. VS: noted--normal. Gen: alert, NAD, NONTOXIC APPEARING. HEENT: eyes without injection, drainage, or swelling.  Ears: EACs clear, TMs with normal light reflex and landmarks.  Nose: Clear rhinorrhea, with some dried, crusty exudate  adherent to mildly injected mucosa.  No purulent d/c.  No paranasal sinus TTP.  No facial swelling.  Throat and mouth without focal lesion.  No pharyngial swelling, erythema, or exudate.   Neck: supple, no LAD.   LUNGS: CTA bilat, nonlabored resps.   CV: RRR, no m/r/g. EXT: no c/c/e SKIN: no rash    IMPRESSION AND PLAN:  Viral URI with mild/early acute asthma exacerbation. Continue current meds, add prednisone 40mg  qd x 5d. Mucinex DM OTC recommended. Signs/symptoms to call or return for were reviewed and pt expressed understanding.  An After Visit Summary was printed and given to the patient.  FOLLOW UP: prn

## 2014-01-04 NOTE — Patient Instructions (Signed)
Buy OTC mucinex DM and take as directed on packaging for cough.

## 2014-02-10 IMAGING — CR DG CHEST 2V
2 series · 2 of 2 positions shown · non-contrast
Comparison: Prior chest x-ray 05/10/2012

CLINICAL DATA: Evaluate pneumonia

CHEST - 2 VIEW

[w chest pa]
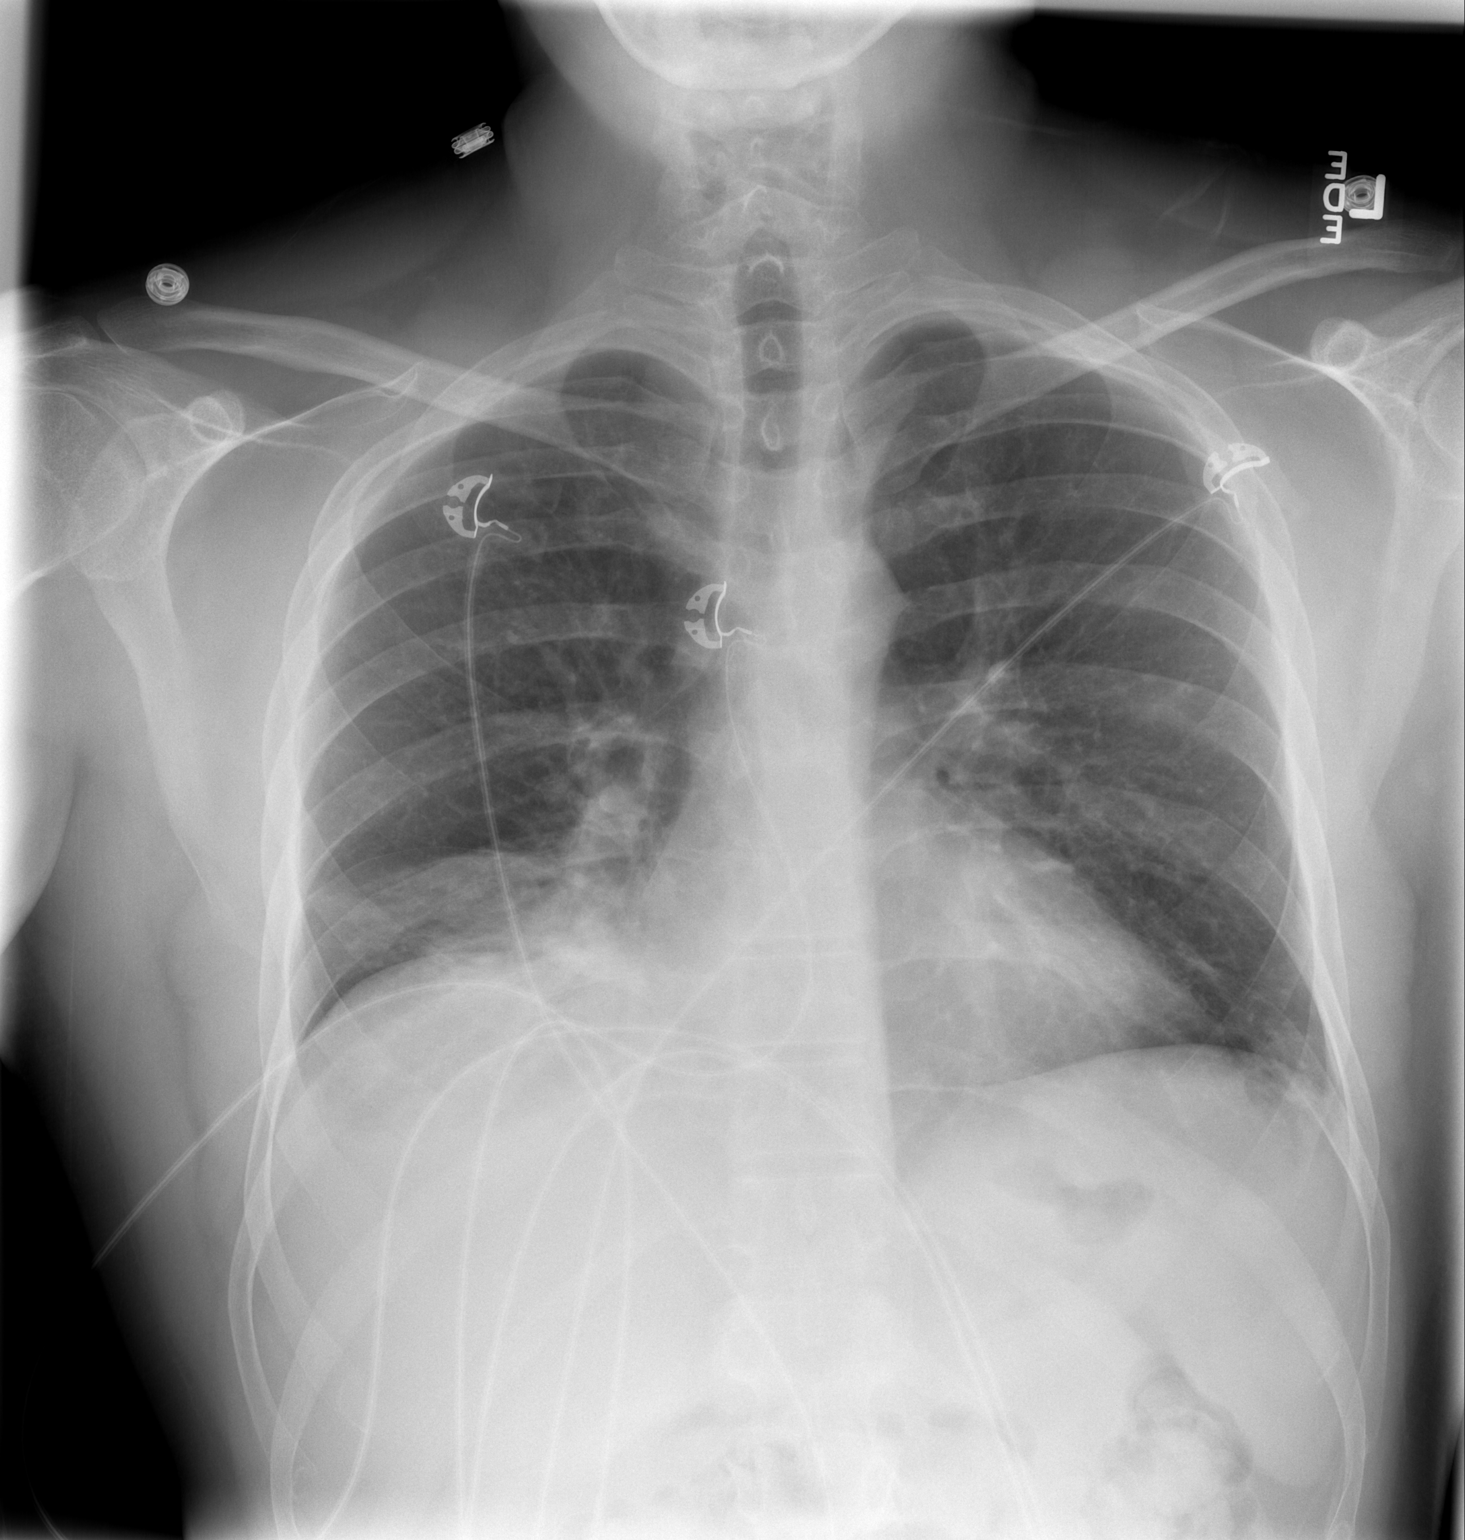

[w chest lat]
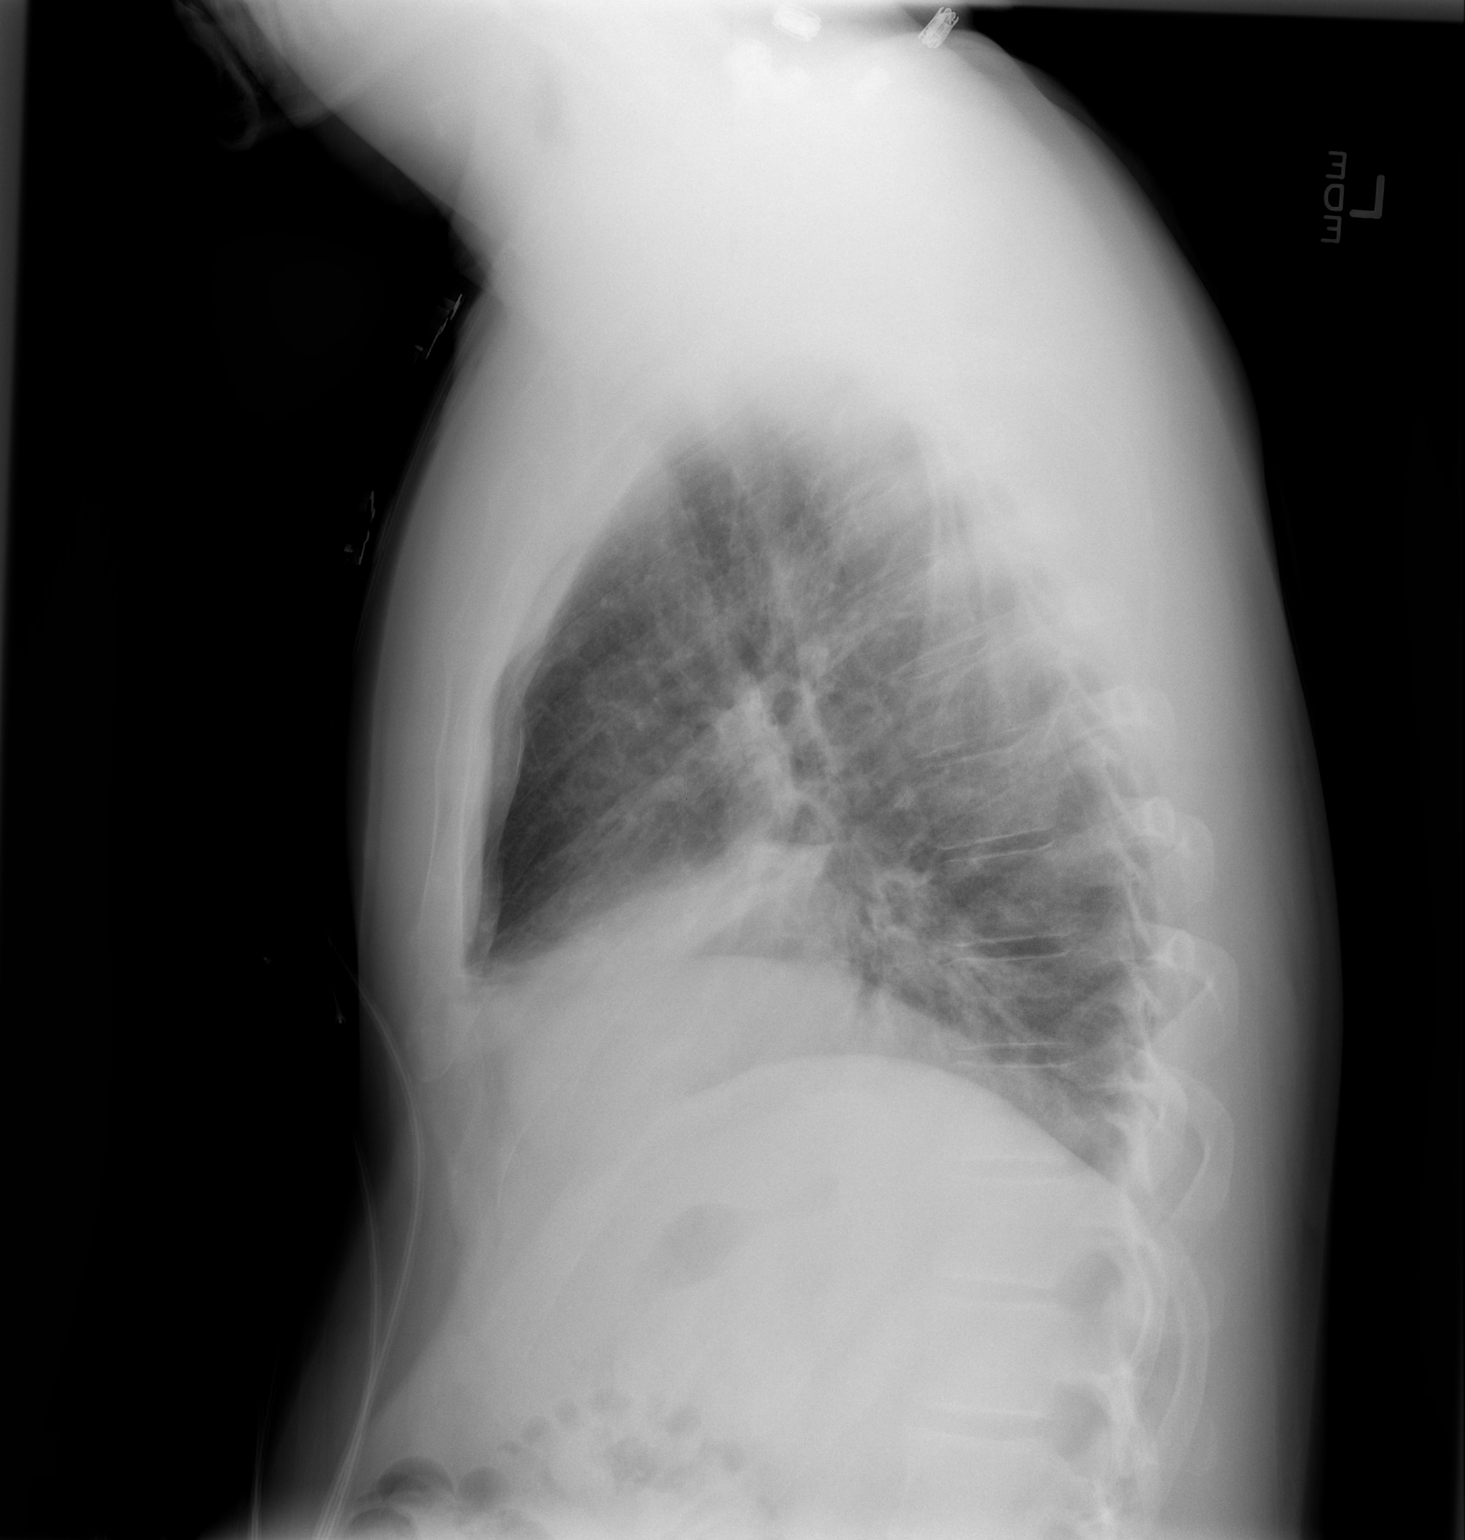

[2 of 2 positions shown; findings below may reference images not displayed]

FINDINGS: Interval development of opacity and volume loss in the
right middle lobe most consistent with right middle lobe collapse.
Streaky opacities in the bilateral bases may reflect additional
areas of subsegmental atelectasis.  Cardiac and mediastinal
contours remain within normal limits.  No pleural effusion or
pneumothorax.
IMPRESSION: 1.  Interval development of right middle lobe collapse.
Superimposed infiltrate is difficult to exclude.  Recommend repeat
chest x-ray after appropriate therapy.

2.  Additional streaky bibasilar opacities also may reflect
atelectasis or additional areas of infiltrate.

## 2014-03-15 IMAGING — CR DG CHEST 2V
2 series · 2 of 2 positions shown · non-contrast
Comparison: 05/11/2012

CLINICAL DATA: Pneumonia, follow-up, shortness of breath

CHEST - 2 VIEW

[view not recorded (1 of 2)]
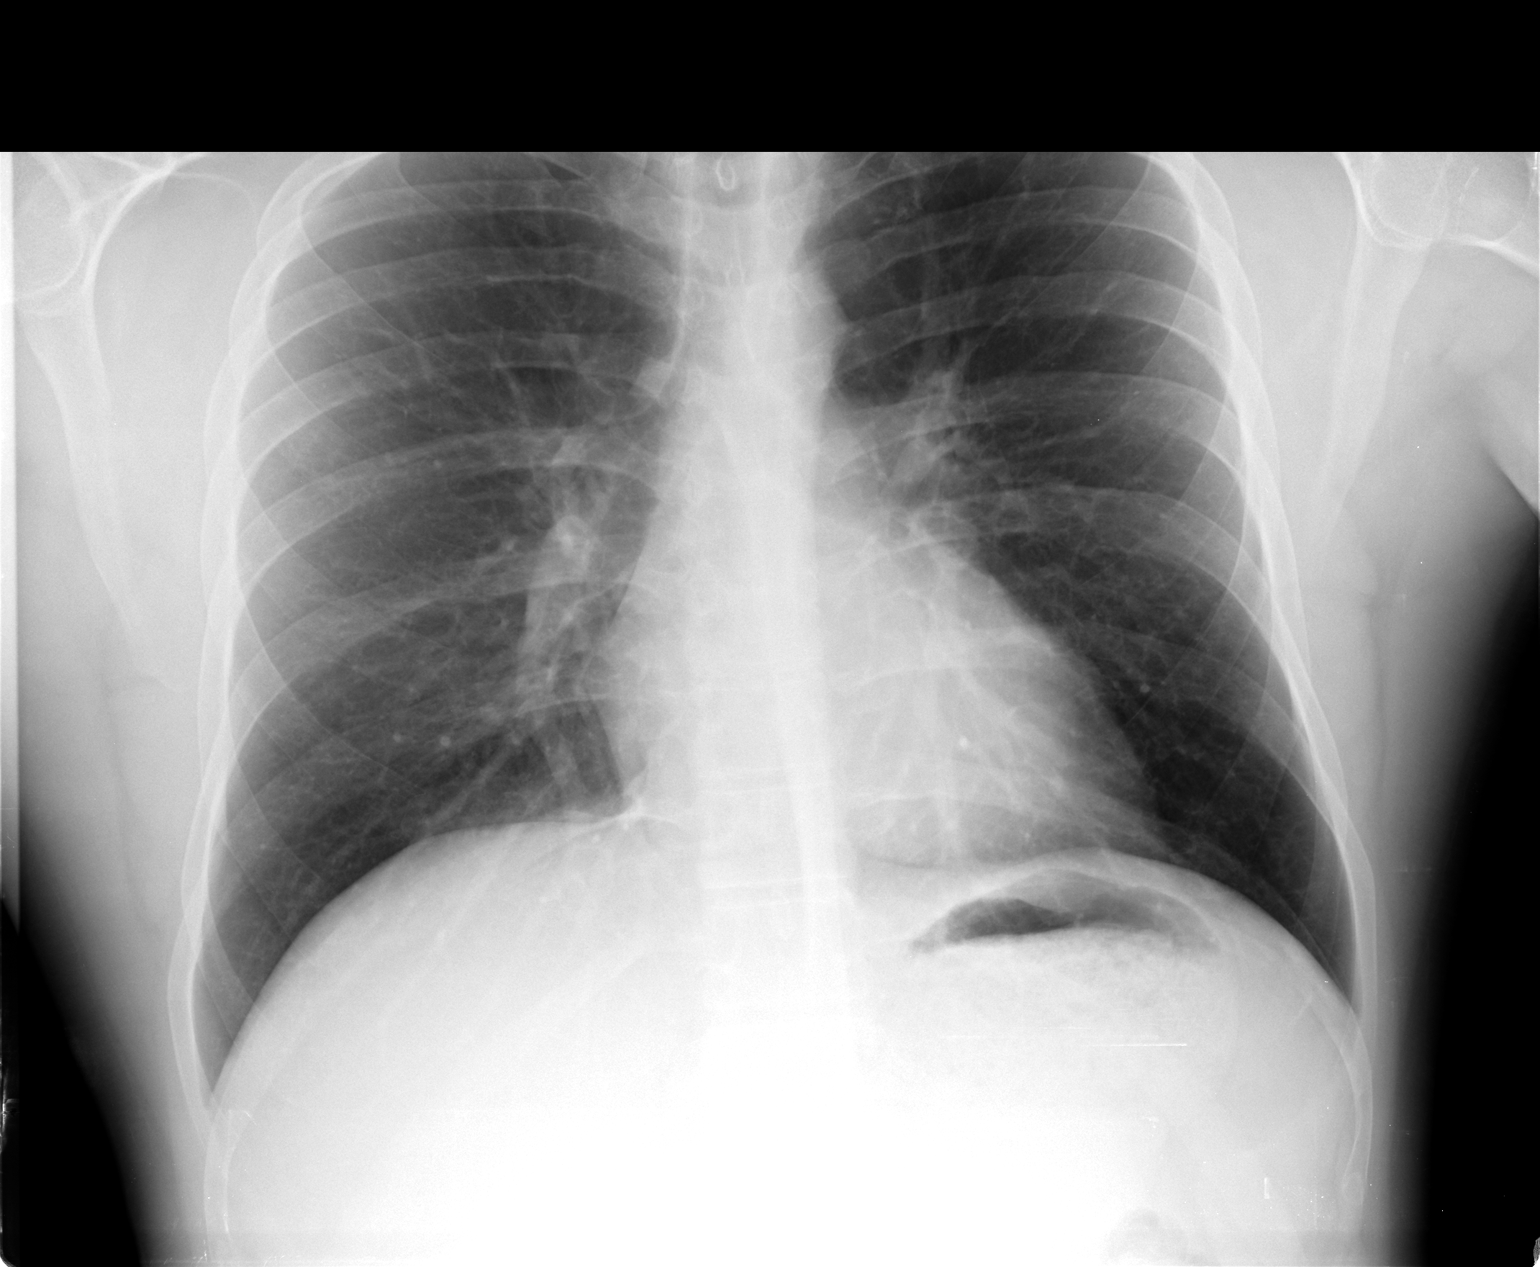

[view not recorded (2 of 2)]
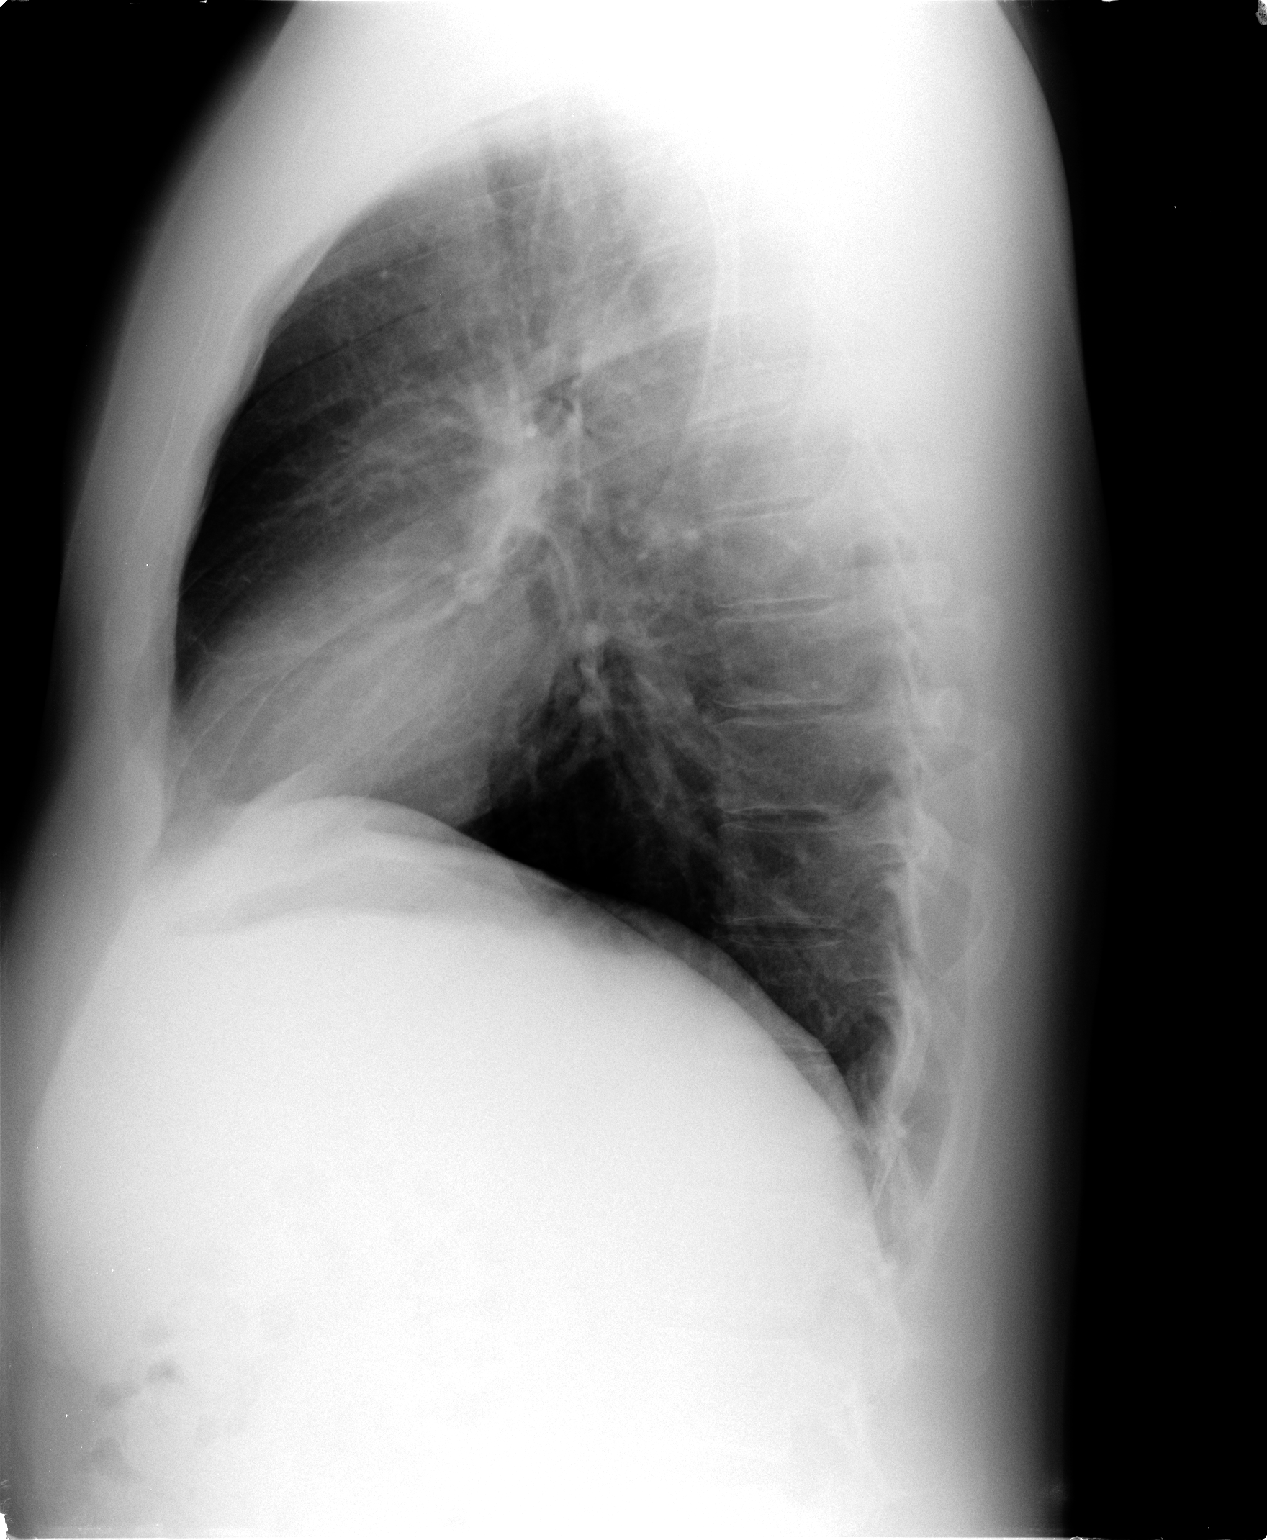

[2 of 2 positions shown; findings below may reference images not displayed]

FINDINGS: Normal heart size, mediastinal contours, and pulmonary vascularity.
Lungs clear.
Previously seen right basilar infiltrate resolved.
No pleural effusion or pneumothorax.
Bones unremarkable.
IMPRESSION: Normal chest.
Resolution of previously identified right basilar infiltrate.

## 2014-05-16 ENCOUNTER — Other Ambulatory Visit: Payer: Self-pay | Admitting: *Deleted

## 2014-05-16 MED ORDER — CLONIDINE HCL 0.1 MG PO TABS: 0.2000 mg | ORAL_TABLET | Freq: Every day | ORAL | Status: DC

## 2014-05-16 NOTE — Telephone Encounter (Signed)
Rx sent 

## 2014-09-05 ENCOUNTER — Ambulatory Visit (INDEPENDENT_AMBULATORY_CARE_PROVIDER_SITE_OTHER): Payer: 59 | Admitting: Family Medicine

## 2014-09-05 ENCOUNTER — Encounter: Payer: Self-pay | Admitting: Family Medicine

## 2014-09-05 VITALS — BP 134/82 | HR 72 | Temp 98.0°F | Resp 16 | Ht 66.5 in | Wt 182.0 lb

## 2014-09-05 DIAGNOSIS — H6991 Unspecified Eustachian tube disorder, right ear: Secondary | ICD-10-CM

## 2014-09-05 DIAGNOSIS — L659 Nonscarring hair loss, unspecified: Secondary | ICD-10-CM

## 2014-09-05 DIAGNOSIS — H6981 Other specified disorders of Eustachian tube, right ear: Secondary | ICD-10-CM

## 2014-09-05 LAB — TSH: TSH: 2.73 u[IU]/mL (ref 0.35–4.50)

## 2014-09-05 NOTE — Progress Notes (Signed)
Pre visit review using our clinic review tool, if applicable. No additional management support is needed unless otherwise documented below in the visit note. 

## 2014-09-05 NOTE — Progress Notes (Signed)
OFFICE NOTE  09/05/2014  CC:  Chief Complaint  Patient presents with  . Ear Pain    right ear x 2 months     HPI: Patient is a 34 y.o. latino male who is here for right ear complaint. R ear infected 4-5 mo ago: amoxil taken. Then in June he had L OM and was given prednisone and z-pack.  Right ear pain is what he feels, head phones bother him.  No drainage.  No hearing loss of ear fullness.  No ear popping sensation.  No URI or cough lately.  Diffuse hair loss noted in mornings last few months.   Extra stress in life lately, moved back from [place] recently. Also asks about checking thyroid level.   Pertinent PMH:  Past medical, surgical, social, and family history reviewed and no changes are noted since last office visit.  MEDS:  Outpatient Prescriptions Prior to Visit  Medication Sig Dispense Refill  . acyclovir (ZOVIRAX) 400 MG tablet Take 1 tablet (400 mg total) by mouth 2 (two) times daily. 60 tablet 6  . albuterol (PROAIR HFA) 108 (90 BASE) MCG/ACT inhaler Inhale 2 puffs into the lungs every 6 (six) hours as needed for wheezing. 1 Inhaler 1  . cloNIDine (CATAPRES) 0.1 MG tablet Take 2 tablets (0.2 mg total) by mouth at bedtime. 180 tablet 1  . fexofenadine (ALLEGRA) 180 MG tablet Take 1 tablet (180 mg total) by mouth daily. 30 tablet 6  . Fluticasone-Salmeterol (ADVAIR DISKUS) 100-50 MCG/DOSE AEPB Inhale 1 puff into the lungs every 12 (twelve) hours. 60 each 6  . ibuprofen (ADVIL,MOTRIN) 200 MG tablet Take 200-400 mg by mouth every 6 (six) hours as needed for pain, fever or headache.    . triamcinolone (NASACORT) 55 MCG/ACT nasal inhaler Place 2 sprays into the nose daily.    . predniSONE (DELTASONE) 20 MG tablet 2 tabs po qd x 5d (Patient not taking: Reported on 09/05/2014) 10 tablet 0   No facility-administered medications prior to visit.    PE: Blood pressure 134/82, pulse 72, temperature 98 F (36.7 C), temperature source Oral, resp. rate 16, height 5' 6.5" (1.689 m),  weight 182 lb (82.555 kg), SpO2 97 %. ENT: Ears: EACs clear, normal epithelium.  TMs with good light reflex and landmarks bilaterally.  Eyes: no injection, icteris, swelling, or exudate.  EOMI, PERRLA. Nose: no drainage or turbinate edema/swelling.  No injection or focal lesion.  Mouth: lips without lesion/swelling.  Oral mucosa pink and moist.  Dentition intact and without obvious caries or gingival swelling.  Oropharynx without erythema, exudate, or swelling.    IMPRESSION AND PLAN:  1) Eustachean tube dysfunction: reassured pt no infection or effusion present. Continue steroid nasal spray, plus blow against closed nostrils multiple times a day prn.  2) Diffuse hair loss last few months: likely stress induced.  Will check TSH.  An After Visit Summary was printed and given to the patient.   FOLLOW UP: prn

## 2014-11-08 ENCOUNTER — Other Ambulatory Visit: Payer: Self-pay | Admitting: *Deleted

## 2014-11-08 MED ORDER — CLONIDINE HCL 0.1 MG PO TABS: 0.2000 mg | ORAL_TABLET | Freq: Every day | ORAL | Status: DC

## 2014-11-08 NOTE — Telephone Encounter (Signed)
RF request for clonidine LOV: 09/05/14 Next ov: None Last written: 05/16/14 #180 w/ 1RF

## 2014-11-29 ENCOUNTER — Encounter: Payer: Self-pay | Admitting: Family Medicine

## 2014-11-29 ENCOUNTER — Ambulatory Visit (INDEPENDENT_AMBULATORY_CARE_PROVIDER_SITE_OTHER): Payer: 59 | Admitting: Family Medicine

## 2014-11-29 VITALS — BP 145/90 | HR 109 | Temp 99.3°F | Resp 18 | Ht 66.5 in | Wt 185.0 lb

## 2014-11-29 DIAGNOSIS — J069 Acute upper respiratory infection, unspecified: Secondary | ICD-10-CM

## 2014-11-29 DIAGNOSIS — H6981 Other specified disorders of Eustachian tube, right ear: Secondary | ICD-10-CM | POA: Diagnosis not present

## 2014-11-29 DIAGNOSIS — Z23 Encounter for immunization: Secondary | ICD-10-CM | POA: Diagnosis not present

## 2014-11-29 DIAGNOSIS — J4540 Moderate persistent asthma, uncomplicated: Secondary | ICD-10-CM | POA: Diagnosis not present

## 2014-11-29 NOTE — Progress Notes (Signed)
OFFICE NOTE  11/29/2014  CC:  Chief Complaint  Patient presents with  . URI  . Allergic Rhinitis    HPI: Patient is a 34 y.o. [place] male who is here for 1-2 d cough/dry mostly.  Some fatigue today.  Some chest tightness, had to use his albut last night and it helped some.  No fever, no ST, no nasal cong/runny nose, no sneezing, no HA.   R ear intermittently feels full/painful--this is a chronic problem on/off. Says he has NOT been taking his advair since moving back to [place] from [place].  Pertinent PMH:  Past medical, surgical, social, and family history reviewed and no changes are noted since last office visit.  MEDS: NOT TAKING ADVAIR LISTED BELOW Outpatient Prescriptions Prior to Visit  Medication Sig Dispense Refill  . acyclovir (ZOVIRAX) 400 MG tablet Take 1 tablet (400 mg total) by mouth 2 (two) times daily. 60 tablet 6  . albuterol (PROAIR HFA) 108 (90 BASE) MCG/ACT inhaler Inhale 2 puffs into the lungs every 6 (six) hours as needed for wheezing. 1 Inhaler 1  . cloNIDine (CATAPRES) 0.1 MG tablet Take 2 tablets (0.2 mg total) by mouth at bedtime. 180 tablet 1  . fexofenadine (ALLEGRA) 180 MG tablet Take 1 tablet (180 mg total) by mouth daily. 30 tablet 6  . Fluticasone-Salmeterol (ADVAIR DISKUS) 100-50 MCG/DOSE AEPB Inhale 1 puff into the lungs every 12 (twelve) hours. 60 each 6  . ibuprofen (ADVIL,MOTRIN) 200 MG tablet Take 200-400 mg by mouth every 6 (six) hours as needed for pain, fever or headache.    . triamcinolone (NASACORT) 55 MCG/ACT nasal inhaler Place 2 sprays into the nose daily.     No facility-administered medications prior to visit.    PE: Blood pressure 145/90, pulse 109, temperature 99.3 F (37.4 C), temperature source Oral, resp. rate 18, height 5' 6.5" (1.689 m), weight 185 lb (83.915 kg), SpO2 97 %. VS: noted--normal. Gen: alert, NAD, WELL APPEARING. HEENT: eyes without injection, drainage, or swelling.  Ears: EACs clear, TMs with normal light reflex and  landmarks.  Nose: Clear rhinorrhea, with some dried, crusty exudate adherent to mildly injected mucosa.  No purulent d/c.  No paranasal sinus TTP.  No facial swelling.  Throat and mouth without focal lesion.  No pharyngial swelling, erythema, or exudate.   Neck: supple, no LAD.   LUNGS: CTA bilat, nonlabored resps.   CV: RRR, no m/r/g. EXT: no c/c/e SKIN: no rash  IMPRESSION AND PLAN:  Viral URI with some mild reactivity of lungs but not an outright asthma flare. Also, chronic R eustachian tube dysfunction worse lately. Recommended symptomatic care for his uri sx's and eustachian tube dysfxn (might try prn otc afrin in R nostril), and recommended he restart his Advair 100/50 1 p bid. Signs/symptoms to call or return for were reviewed and pt expressed understanding.  Flu vaccine given today.  An After Visit Summary was printed and given to the patient.  FOLLOW UP: prn

## 2014-11-29 NOTE — Progress Notes (Signed)
Pre visit review using our clinic review tool, if applicable. No additional management support is needed unless otherwise documented below in the visit note. 

## 2014-11-29 NOTE — Patient Instructions (Signed)
Restart your advair: 1 puff twice a day  Take OTC mucinex DM for cold/cough.

## 2015-02-02 DIAGNOSIS — J341 Cyst and mucocele of nose and nasal sinus: Secondary | ICD-10-CM

## 2015-02-02 HISTORY — DX: Cyst and mucocele of nose and nasal sinus: J34.1

## 2015-02-12 ENCOUNTER — Telehealth: Payer: Self-pay | Admitting: *Deleted

## 2015-02-12 ENCOUNTER — Encounter: Payer: Self-pay | Admitting: Family Medicine

## 2015-02-12 NOTE — Telephone Encounter (Signed)
No need to come see me. The cysts COULD be causing ear symptoms but most likely they are NOT.

## 2015-02-12 NOTE — Telephone Encounter (Signed)
Pt called and stated that he recently seen his dentist and they did some xrays which showed several cyst in his sinuses he stated that they are referring him to general surgery to have this evaluated. He stated that he didn't know if he needed to come in for an apt with Dr. Milinda CaveMcGowen or not. He stated that he just wanted to make sure Dr. Milinda CaveMcGowen was aware. He stated that if he needs to come in he can and that he can bring copy of xray. I advised him that I would sent back a message to Dr. Milinda CaveMcGowen and that an apt may not be needed at time time. He also wanted to know if Dr. Milinda CaveMcGowen thinks that they cyst could be causing the ear pain/pressure he has been experiencing. Please advise. Thanks.

## 2015-02-13 NOTE — Telephone Encounter (Signed)
Pt advised and voiced understanding.   

## 2015-07-30 ENCOUNTER — Telehealth: Payer: Self-pay | Admitting: *Deleted

## 2015-07-30 MED ORDER — CLONIDINE HCL 0.1 MG PO TABS: 0.2000 mg | ORAL_TABLET | Freq: Every day | ORAL | Status: DC

## 2015-07-30 NOTE — Telephone Encounter (Signed)
Pt advised and voiced understanding.  He stated that he will call back to schedule apt.  

## 2015-07-30 NOTE — Telephone Encounter (Signed)
Left message for pt to call back  °

## 2015-07-30 NOTE — Telephone Encounter (Signed)
RF request for clinidine LOV: 09/18/13 Next ov: None Last written: 11/08/14 #180 w/ 1RF  Rx sent for #180 w/ 0Rf. Pt needs office visit for more refills.

## 2015-11-04 ENCOUNTER — Ambulatory Visit (INDEPENDENT_AMBULATORY_CARE_PROVIDER_SITE_OTHER): Payer: 59 | Admitting: Family Medicine

## 2015-11-04 ENCOUNTER — Encounter: Payer: Self-pay | Admitting: Family Medicine

## 2015-11-04 VITALS — BP 128/93 | HR 84 | Temp 98.3°F | Resp 16 | Ht 68.0 in | Wt 183.8 lb

## 2015-11-04 DIAGNOSIS — M26621 Arthralgia of right temporomandibular joint: Secondary | ICD-10-CM | POA: Diagnosis not present

## 2015-11-04 DIAGNOSIS — Z23 Encounter for immunization: Secondary | ICD-10-CM

## 2015-11-04 DIAGNOSIS — Z0000 Encounter for general adult medical examination without abnormal findings: Secondary | ICD-10-CM

## 2015-11-04 LAB — CBC WITH DIFFERENTIAL/PLATELET
BASOS PCT: 0.5 % (ref 0.0–3.0)
Basophils Absolute: 0 10*3/uL (ref 0.0–0.1)
EOS PCT: 4.8 % (ref 0.0–5.0)
Eosinophils Absolute: 0.4 10*3/uL (ref 0.0–0.7)
HEMATOCRIT: 50.2 % (ref 39.0–52.0)
HEMOGLOBIN: 17.2 g/dL — AB (ref 13.0–17.0)
LYMPHS PCT: 25.5 % (ref 12.0–46.0)
Lymphs Abs: 2.1 10*3/uL (ref 0.7–4.0)
MCHC: 34.2 g/dL (ref 30.0–36.0)
MCV: 90.9 fl (ref 78.0–100.0)
Monocytes Absolute: 0.4 10*3/uL (ref 0.1–1.0)
Monocytes Relative: 4.7 % (ref 3.0–12.0)
NEUTROS ABS: 5.3 10*3/uL (ref 1.4–7.7)
Neutrophils Relative %: 64.5 % (ref 43.0–77.0)
PLATELETS: 240 10*3/uL (ref 150.0–400.0)
RBC: 5.52 Mil/uL (ref 4.22–5.81)
RDW: 13.2 % (ref 11.5–15.5)
WBC: 8.2 10*3/uL (ref 4.0–10.5)

## 2015-11-04 LAB — COMPREHENSIVE METABOLIC PANEL
ALT: 20 U/L (ref 0–53)
AST: 17 U/L (ref 0–37)
Albumin: 4.7 g/dL (ref 3.5–5.2)
Alkaline Phosphatase: 44 U/L (ref 39–117)
BUN: 15 mg/dL (ref 6–23)
CHLORIDE: 104 meq/L (ref 96–112)
CO2: 29 meq/L (ref 19–32)
Calcium: 9.7 mg/dL (ref 8.4–10.5)
Creatinine, Ser: 1 mg/dL (ref 0.40–1.50)
GFR: 90.93 mL/min (ref >60.00)
GLUCOSE: 86 mg/dL (ref 70–99)
POTASSIUM: 4.3 meq/L (ref 3.5–5.1)
SODIUM: 139 meq/L (ref 135–145)
Total Bilirubin: 0.9 mg/dL (ref 0.2–1.2)
Total Protein: 7.4 g/dL (ref 6.0–8.3)

## 2015-11-04 LAB — LIPID PANEL
CHOL/HDL RATIO: 4
Cholesterol: 210 mg/dL — ABNORMAL HIGH (ref 0–200)
HDL: 52.9 mg/dL (ref >39.00)
LDL CALC: 136 mg/dL — AB (ref 0–99)
NONHDL: 157.46
Triglycerides: 107 mg/dL (ref 0.0–149.0)
VLDL: 21.4 mg/dL (ref 0.0–40.0)

## 2015-11-04 LAB — TSH: TSH: 1.8 u[IU]/mL (ref 0.35–4.50)

## 2015-11-04 MED ORDER — ACYCLOVIR 400 MG PO TABS: 400.0000 mg | ORAL_TABLET | Freq: Two times a day (BID) | ORAL | 12 refills | Status: DC

## 2015-11-04 MED ORDER — CLONIDINE HCL 0.1 MG PO TABS: 0.2000 mg | ORAL_TABLET | Freq: Every day | ORAL | 3 refills | Status: DC

## 2015-11-04 MED ORDER — ALBUTEROL SULFATE HFA 108 (90 BASE) MCG/ACT IN AERS: 2.0000 | INHALATION_SPRAY | Freq: Four times a day (QID) | RESPIRATORY_TRACT | 1 refills | Status: DC | PRN

## 2015-11-04 NOTE — Progress Notes (Signed)
Pre visit review using our clinic review tool, if applicable. No additional management support is needed unless otherwise documented below in the visit note. 

## 2015-11-04 NOTE — Progress Notes (Signed)
Office Note 11/04/2015  CC:  Chief Complaint  Patient presents with  . Annual Exam    CPE    HPI:  Matthew Petty is a 35 y.o.  male who is here for annual health maintenance exam. He is fasting. Clonidine helping for insomnia, ran out 2 d/a and having trouble sleeping.  Has intermittent R ear pain anterior to the ear mostly, worse with stress and with talking a lot. No ear popping or hearing problems.  No jaw clicking noise heard.  He does grind his teeth in his sleep and has been told by his dentist in the past to get an otc oral appliance to prevent this.  He saw a maxillofacial surgeon for sinus cyst found at dentist's office and was reassured that nothing needs to be done.   Past Medical History:  Diagnosis Date  . Allergic rhinitis    ragweed  . Anxiety    Failed trial of wellbutrin in the past  . Collapsed lung 05/2012   Collapsed RML in the midst of hospitalization for asthma exacerbation with RML pneumonia (pt to f/u with Dr. Marchelle Gearing)  . 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 Elevated blood pressure reading without diagnosis of hypertension   . Herpes simplex    genital, recurrent  . Insomnia 10/21/2010  . Moderate persistent asthma   . 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 No pertinent surgical history.  Family History  Problem Relation Age of Onset  . Cancer Mother     Thyroid: cured (age 76)  . Cancer Paternal Grandfather     Prostate cancer in his 76s  . Heart disease Maternal Grandfather   . Mental illness Maternal Grandfather     bipolar?    Social History   Social History  . Marital status: Married    Spouse name: Tresa Endo  . Number of children: N/A  . Years of education: N/A   Occupational History  .  [employer]   Social History Main Topics  . Smoking status: Never Smoker  . Smokeless tobacco: Never Used  .  Alcohol use Yes     Comment: weekend  . Drug use: No  . Sexual activity: Not on file   Other Topics Concern  . Not on file   Social History Narrative   Married, has one son.   Moved to [place] from [place] around 2006.   Occupation: Agricultural engineer.   Has postgrad degree in business/finance.   No exercise.   No tobacco or drugs.  Drinks some alcohol on weekends/socially.    Outpatient Medications Prior to Visit  Medication Sig Dispense Refill  . fexofenadine (ALLEGRA) 180 MG tablet Take 1 tablet (180 mg total) by mouth daily. 30 tablet 6  . Fluticasone-Salmeterol (ADVAIR DISKUS) 100-50 MCG/DOSE AEPB Inhale 1 puff into the lungs every 12 (twelve) hours. 60 each 6  . ibuprofen (ADVIL,MOTRIN) 200 MG tablet Take 200-400 mg by mouth every 6 (six) hours as needed for pain, fever or headache.    . triamcinolone (NASACORT) 55 MCG/ACT nasal inhaler Place 2 sprays into the nose daily.    Marland Kitchen acyclovir (ZOVIRAX) 400 MG tablet Take 1 tablet (400 mg total) by mouth 2 (two) times daily. 60 tablet 6  . albuterol (PROAIR HFA) 108 (90 BASE) MCG/ACT inhaler Inhale 2 puffs into the lungs every  6 (six) hours as needed for wheezing. 1 Inhaler 1  . cloNIDine (CATAPRES) 0.1 MG tablet Take 2 tablets (0.2 mg total) by mouth at bedtime. 180 tablet 0   No facility-administered medications prior to visit.     No Known Allergies  ROS Review of Systems  Constitutional: Negative for appetite change, chills, fatigue and fever.  HENT: Positive for ear pain (right, see HPI). Negative for congestion, dental problem and sore throat.   Eyes: Negative for discharge, redness and visual disturbance.  Respiratory: Negative for cough, chest tightness, shortness of breath and wheezing.   Cardiovascular: Negative for chest pain, palpitations and leg swelling.  Gastrointestinal: Negative for abdominal pain, blood in stool, diarrhea, nausea and vomiting.  Genitourinary: Negative for difficulty urinating,  dysuria, flank pain, frequency, hematuria and urgency.  Musculoskeletal: Negative for arthralgias, back pain, joint swelling, myalgias and neck stiffness.  Skin: Negative for pallor and rash.  Neurological: Negative for dizziness, speech difficulty, weakness and headaches.  Hematological: Negative for adenopathy. Does not bruise/bleed easily.  Psychiatric/Behavioral: Negative for confusion and sleep disturbance. The patient is not nervous/anxious.     PE; Blood pressure (!) 128/93, pulse 84, temperature 98.3 F (36.8 C), resp. rate 16, height 5\' 8"  (1.727 m), weight 183 lb 12.8 oz (83.4 kg), SpO2 95 %. Gen: Alert, well appearing.  Patient is oriented to person, place, time, and situation. AFFECT: pleasant, lucid thought and speech. ENT: Ears: EACs clear, normal epithelium.  TMs with good light reflex and landmarks bilaterally.  Eyes: no injection, icteris, swelling, or exudate.  EOMI, PERRLA.  No tenderness of TMJ joints.  He has clicking/excess laxity in both TMJs. Nose: no drainage or turbinate edema/swelling.  No injection or focal lesion.  Mouth: lips without lesion/swelling.  Oral mucosa pink and moist.  Dentition intact and without obvious caries or gingival swelling.  Oropharynx without erythema, exudate, or swelling.  Neck: supple/nontender.  No LAD, mass, or TM.   CV: RRR, no m/r/g.   LUNGS: CTA bilat, nonlabored resps, good aeration in all lung fields. ABD: soft, NT, ND, BS normal.  No hepatospenomegaly or mass.  No bruits. EXT: no clubbing, cyanosis, or edema.  Musculoskeletal: no joint swelling, erythema, warmth, or tenderness.  ROM of all joints intact. Skin - no sores or suspicious lesions or rashes or color changes   Pertinent labs:  Lab Results  Component Value Date   TSH 2.73 09/05/2014   Lab Results  Component Value Date   WBC 7.7 09/18/2013   HGB 16.5 09/18/2013   HCT 48.6 09/18/2013   MCV 92.2 09/18/2013   PLT 242.0 09/18/2013   Lab Results  Component Value  Date   CREATININE 0.9 09/18/2013   BUN 12 09/18/2013   NA 138 09/18/2013   K 4.2 09/18/2013   CL 106 09/18/2013   CO2 25 09/18/2013   Lab Results  Component Value Date   ALT 21 09/18/2013   AST 22 09/18/2013   ALKPHOS 44 09/18/2013   BILITOT 1.0 09/18/2013   Lab Results  Component Value Date   CHOL 204 (H) 09/18/2013   Lab Results  Component Value Date   HDL 46.50 09/18/2013   Lab Results  Component Value Date   LDLCALC 139 (H) 09/18/2013   Lab Results  Component Value Date   TRIG 92.0 09/18/2013   Lab Results  Component Value Date   CHOLHDL 4 09/18/2013    ASSESSMENT AND PLAN:   Health maintenance exam: Reviewed age and gender appropriate health maintenance issues (  prudent diet, regular exercise, health risks of tobacco and excessive alcohol, use of seatbelts, fire alarms in home, use of sunscreen).  Also reviewed age and gender appropriate health screening as well as vaccine recommendations. HP labs drawn today. Flu vaccine today.  He has R TMJ dysfunction.  He'll try to get me the x-rays he had done at his dentist's office recently. I gave handout of isometric exercises for this syndrome.  If worsens or if these exercises are of no help over the next few months then I recommended he return to dentist to get a custom oral appliance made to wear during sleep to prevent bruxism.  An After Visit Summary was printed and given to the patient.  FOLLOW UP:  Return in about 1 year (around 11/03/2016) for annual CPE (fasting).  Signed:  Santiago BumpersPhil McGowen, MD           11/04/2015

## 2015-11-04 NOTE — Addendum Note (Signed)
Addended by: Regan RakersAY, Axiel Fjeld K on: 11/04/2015 09:22 AM   Modules accepted: Orders

## 2016-06-24 ENCOUNTER — Encounter: Payer: Self-pay | Admitting: Family Medicine

## 2016-06-24 ENCOUNTER — Ambulatory Visit (INDEPENDENT_AMBULATORY_CARE_PROVIDER_SITE_OTHER): Payer: 59 | Admitting: Family Medicine

## 2016-06-24 ENCOUNTER — Ambulatory Visit: Payer: 59 | Admitting: Family Medicine

## 2016-06-24 VITALS — BP 123/77 | HR 84 | Temp 98.6°F | Resp 16 | Ht 68.0 in | Wt 180.0 lb

## 2016-06-24 DIAGNOSIS — J4541 Moderate persistent asthma with (acute) exacerbation: Secondary | ICD-10-CM

## 2016-06-24 DIAGNOSIS — M26621 Arthralgia of right temporomandibular joint: Secondary | ICD-10-CM

## 2016-06-24 MED ORDER — FLUTICASONE-SALMETEROL 100-50 MCG/DOSE IN AEPB: 1.0000 | INHALATION_SPRAY | Freq: Two times a day (BID) | RESPIRATORY_TRACT | 11 refills | Status: DC

## 2016-06-24 MED ORDER — PREDNISONE 20 MG PO TABS: ORAL_TABLET | ORAL | 0 refills | Status: DC

## 2016-06-24 MED ORDER — ALBUTEROL SULFATE HFA 108 (90 BASE) MCG/ACT IN AERS: 2.0000 | INHALATION_SPRAY | Freq: Four times a day (QID) | RESPIRATORY_TRACT | 1 refills | Status: DC | PRN

## 2016-06-24 NOTE — Progress Notes (Signed)
OFFICE VISIT  06/24/2016   CC:  Chief Complaint  Patient presents with  . Cough    x 1.5 week   HPI:    Patient is a 36 y.o.  male who presents for cough. Onset about 10d/a, several family members sick with resp illness at that time. Cough gradually worsening.  Starting to cough up phlegm lately.  More fatigued last 1-2d. No fevers.  A little wheezing and chest tightness.  Using albuterol a few times, mainly in the night. He had been off advair up until this time and has now restarted it. No significant nasal sx's or sinus pressure.  Some hoarseness last few days.  ROS: no HA, no N/V/D.  No rash.  No hemoptysis.  No CP. R TMJ arthralgia worse lately.  Pt had lots of questions about this and recent o/v with his orthodontist. Pt has been doing jaw resistance exercises regularly.  Past Medical History:  Diagnosis Date  . Allergic rhinitis    ragweed  . Anxiety    Failed trial of wellbutrin in the past  . Collapsed lung 05/2012   Collapsed RML in the midst of hospitalization for asthma exacerbation with RML pneumonia (pt to f/u with Dr. Marchelle Gearing)  . 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 Elevated blood pressure reading without diagnosis of hypertension   . Herpes simplex    genital, recurrent  . Insomnia 10/21/2010  . Moderate persistent asthma   . 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  Outpatient Medications Prior to Visit  Medication Sig Dispense Refill  . acyclovir (ZOVIRAX) 400 MG tablet Take 1 tablet (400 mg total) by mouth 2 (two) times daily. 60 tablet 12  . cloNIDine (CATAPRES) 0.1 MG tablet Take 2 tablets (0.2 mg total) by mouth at bedtime. 180 tablet 3  . fexofenadine (ALLEGRA) 180 MG tablet Take 1 tablet (180 mg total) by mouth daily. 30 tablet 6  . ibuprofen (ADVIL,MOTRIN) 200 MG tablet Take 200-400 mg by mouth every 6 (six)  hours as needed for pain, fever or headache.    . triamcinolone (NASACORT) 55 MCG/ACT nasal inhaler Place 2 sprays into the nose daily.    Marland Kitchen albuterol (PROAIR HFA) 108 (90 Base) MCG/ACT inhaler Inhale 2 puffs into the lungs every 6 (six) hours as needed for wheezing. 1 Inhaler 1  . Fluticasone-Salmeterol (ADVAIR DISKUS) 100-50 MCG/DOSE AEPB Inhale 1 puff into the lungs every 12 (twelve) hours. 60 each 6   No facility-administered medications prior to visit.     No Known Allergies  ROS As per HPI  PE: Blood pressure 123/77, pulse 84, temperature 98.6 F (37 C), temperature source Oral, resp. rate 16, height 5\' 8"  (1.727 m), weight 180 lb (81.6 kg), SpO2 97 %. Gen: Alert, well appearing.  Patient is oriented to person, place, time, and situation. AFFECT: pleasant, lucid thought and speech. ENT: Ears: EACs clear, normal epithelium.  TMs with good light reflex and landmarks bilaterally.  Eyes: no injection, icteris, swelling, or exudate.  EOMI, PERRLA. Nose: no drainage or turbinate edema/swelling.  No injection or focal lesion.  Mouth: lips without lesion/swelling.  Oral mucosa pink and moist.  Dentition intact and without obvious caries or gingival swelling.  Oropharynx without erythema, exudate, or swelling.  Neck - No masses or thyromegaly or limitation in range of motion  CV: RRR, no m/r/g.   LUNGS: CTA bilat, nonlabored resps, good aeration in all lung fields.  Occ dry cough with exhalation.  Mild decreased aeration at the end of forced expiration diffusely.   EXT: no clubbing, cyanosis, or edema.    LABS:    Chemistry      Component Value Date/Time   NA 139 11/04/2015 0908   K 4.3 11/04/2015 0908   CL 104 11/04/2015 0908   CO2 29 11/04/2015 0908   BUN 15 11/04/2015 0908   CREATININE 1.00 11/04/2015 0908      Component Value Date/Time   CALCIUM 9.7 11/04/2015 0908   ALKPHOS 44 11/04/2015 0908   AST 17 11/04/2015 0908   ALT 20 11/04/2015 0908   BILITOT 0.9 11/04/2015 0908       IMPRESSION AND PLAN:  1) Acute asthma exacerbation, suspect triggered by virus. No signif signs of bacterial infection at this time. Prednisone 40mg  qd x 5d, then 20mg  qd x 5d. Recommended he restart DAILY use of his advair 100/50, 1 p bid.  Continue albuterol HFA 1-2 p q6h prn.  2) TMJ arthralgia, right.  Pt did ask about his R sided TMJ arthralgia.  He has a mouth guard he bought but only used it a few times. He will start wearing this nightly and see if this helps his TMJ arthralgia.  However, his orthodontist felt like any use of braces to straighten his crooked teeth may make his TMJ worse, so she recommended [facility] to better determine if braces will make his TMJ worse.  Pt hesitant to go to this specialist b/c of cost.  He will hold off on going to specialist at this time, but will call for referral if he decides to proceed with this in the future.  Spent 25 min with pt today, with >50% of this time spent in counseling and care coordination regarding the above problems: discussed correct use of asthma meds---controller meds vs rescue meds, potential effects of repeated doses of prednisone.  Also discussed TMJ--triggers, treatments, and the consideration of seeing [facility] specialist to determine if straightening his teeth with braces will cause worsening of his TMJ.  An After Visit Summary was printed and given to the patient.  FOLLOW UP: Return if symptoms worsen or fail to improve.  Signed:  Santiago BumpersPhil Justyn Boyson, MD           06/24/2016

## 2016-06-29 ENCOUNTER — Telehealth: Payer: Self-pay | Admitting: Family Medicine

## 2016-06-29 NOTE — Telephone Encounter (Signed)
Appointment scheduled for patient for 06/30/16.

## 2016-06-29 NOTE — Telephone Encounter (Signed)
Returned call, no answer, left message on voice mail to return call.

## 2016-06-29 NOTE — Telephone Encounter (Signed)
Patient is not feeling any better. Please call

## 2016-06-30 ENCOUNTER — Encounter: Payer: Self-pay | Admitting: Family Medicine

## 2016-06-30 ENCOUNTER — Ambulatory Visit: Payer: 59 | Admitting: Family Medicine

## 2016-06-30 ENCOUNTER — Ambulatory Visit (INDEPENDENT_AMBULATORY_CARE_PROVIDER_SITE_OTHER): Payer: 59 | Admitting: Family Medicine

## 2016-06-30 VITALS — BP 133/89 | HR 94 | Temp 98.4°F | Resp 20 | Wt 180.5 lb

## 2016-06-30 DIAGNOSIS — J40 Bronchitis, not specified as acute or chronic: Secondary | ICD-10-CM

## 2016-06-30 MED ORDER — AZITHROMYCIN 250 MG PO TABS: ORAL_TABLET | ORAL | 0 refills | Status: DC

## 2016-06-30 MED ORDER — BENZONATATE 200 MG PO CAPS: 200.0000 mg | ORAL_CAPSULE | Freq: Two times a day (BID) | ORAL | 0 refills | Status: DC | PRN

## 2016-06-30 NOTE — Patient Instructions (Signed)
Z-pack called in  For you, as well as tessalon Perles for the cough.  Use mucinex DM as well for cough/secreations.  Continue prior instructions from Dr. Milinda CaveMcGowen as well.    Acute Bronchitis, Adult Acute bronchitis is when air tubes (bronchi) in the lungs suddenly get swollen. The condition can make it hard to breathe. It can also cause these symptoms:  A cough.  Coughing up clear, yellow, or green mucus.  Wheezing.  Chest congestion.  Shortness of breath.  A fever.  Body aches.  Chills.  A sore throat. Follow these instructions at home: Medicines   Take over-the-counter and prescription medicines only as told by your doctor.  If you were prescribed an antibiotic medicine, take it as told by your doctor. Do not stop taking the antibiotic even if you start to feel better. General instructions   Rest.  Drink enough fluids to keep your pee (urine) clear or pale yellow.  Avoid smoking and secondhand smoke. If you smoke and you need help quitting, ask your doctor. Quitting will help your lungs heal faster.  Use an inhaler, cool mist vaporizer, or humidifier as told by your doctor.  Keep all follow-up visits as told by your doctor. This is important. How is this prevented? To lower your risk of getting this condition again:  Wash your hands often with soap and water. If you cannot use soap and water, use hand sanitizer.  Avoid contact with people who have cold symptoms.  Try not to touch your hands to your mouth, nose, or eyes.  Make sure to get the flu shot every year. Contact a doctor if:  Your symptoms do not get better in 2 weeks. Get help right away if:  You cough up blood.  You have chest pain.  You have very bad shortness of breath.  You become dehydrated.  You faint (pass out) or keep feeling like you are going to pass out.  You keep throwing up (vomiting).  You have a very bad headache.  Your fever or chills gets worse. This information is  not intended to replace advice given to you by your health care provider. Make sure you discuss any questions you have with your health care provider. Document Released: 07/07/2007 Document Revised: 08/27/2015 Document Reviewed: 07/09/2015 Elsevier Interactive Patient Education  2017 [employer]

## 2016-06-30 NOTE — Progress Notes (Signed)
Matthew Petty , [birth year], 36 y.o., male MRN: [id number] Patient Care Team    Relationship Specialty Notifications Start End  McGowen, Maryjean Morn, MD PCP - General Family Medicine  10/21/10     Chief Complaint  Patient presents with  . URI    cough,fatigue     Subjective: Pt presents for an OV with complaints of unchanged cough, fatigue and subjective fever the last two nights. Pt was seen about 6 days ago and dx with asthma exacerbation. He is taking the prednisone, allegra, nasal spray and inhaler as prescribed. He also endorses mild nausea that started today.   Depression screen PHQ 2/9 11/04/2015  Decreased Interest 0  Down, Depressed, Hopeless 0  PHQ - 2 Score 0    No Known Allergies Social History  Substance Use Topics  . Smoking status: Never Smoker  . Smokeless tobacco: Never Used  . Alcohol use Yes     Comment: weekend   Past Medical History:  Diagnosis Date  . Allergic rhinitis    ragweed  . Anxiety    Failed trial of wellbutrin in the past  . Collapsed lung 05/2012   Collapsed RML in the midst of hospitalization for asthma exacerbation with RML pneumonia (pt to f/u with Dr. Marchelle Gearing)  . 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 Elevated blood pressure reading without diagnosis of hypertension   . Herpes simplex    genital, recurrent  . Insomnia 10/21/2010  . Moderate persistent asthma   . 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 No pertinent surgical history. Family History  Problem Relation Age of Onset  . Cancer Mother        Thyroid: cured (age 3)  . Cancer Paternal Grandfather        Prostate cancer in his 41s  . Heart disease Maternal Grandfather   . Mental illness Maternal Grandfather        bipolar?   Allergies as of 06/30/2016   No Known Allergies     Medication List       Accurate as of 06/30/16  3:23 PM. Always use  your most recent med list.          acyclovir 400 MG tablet Commonly known as:  ZOVIRAX Take 1 tablet (400 mg total) by mouth 2 (two) times daily.   albuterol 108 (90 Base) MCG/ACT inhaler Commonly known as:  PROAIR HFA Inhale 2 puffs into the lungs every 6 (six) hours as needed for wheezing.   cloNIDine 0.1 MG tablet Commonly known as:  CATAPRES Take 2 tablets (0.2 mg total) by mouth at bedtime.   fexofenadine 180 MG tablet Commonly known as:  ALLEGRA Take 1 tablet (180 mg total) by mouth daily.   Fluticasone-Salmeterol 100-50 MCG/DOSE Aepb Commonly known as:  ADVAIR DISKUS Inhale 1 puff into the lungs every 12 (twelve) hours.   ibuprofen 200 MG tablet Commonly known as:  ADVIL,MOTRIN Take 200-400 mg by mouth every 6 (six) hours as needed for pain, fever or headache.   predniSONE 20 MG tablet Commonly known as:  DELTASONE 2 tabs po qd x 5d, then 1 tab po qd x 5d   triamcinolone 55 MCG/ACT nasal inhaler Commonly known as:  NASACORT Place 2 sprays into the nose daily.       All past medical history, surgical history, allergies, family history,  immunizations andmedications were updated in the EMR today and reviewed under the history and medication portions of their EMR.     ROS: Negative, with the exception of above mentioned in HPI   Objective:  BP 133/89 (BP Location: Right Arm, Patient Position: Sitting, Cuff Size: Large)   Pulse 94   Temp 98.4 F (36.9 C)   Resp 20   Wt 180 lb 8 oz (81.9 kg)   SpO2 97%   BMI 27.44 kg/m  Body mass index is 27.44 kg/m. Gen: Afebrile. No acute distress. Nontoxic in appearance, well developed, well nourished.  HENT: AT. [place]. Bilateral TM visualized with fullness, no erythema or bulging. MMM, no oral lesions. Bilateral nares with mild swelling, drainage, no erythema. Throat without erythema or exudates. Mild nonproductive cough. Mild hoarseness.  Eyes:Pupils Equal Round Reactive to light, Extraocular movements intact,   Conjunctiva without redness, discharge or icterus. Neck/lymp/endocrine: Supple,no lymphadenopathy CV: RRR  Chest: CTAB, no wheeze or crackles. Good air movement, normal resp effort.  Skin: no rashes, purpura or petechiae.    No exam data present No results found. No results found for this or any previous visit (from the past 24 hour(s)).  Assessment/Plan: Matthew Petty is a 36 y.o. male present for OV for  Bronchitis - Lung exam normal today. However pt with continuing cough, fatigue and now fever through the last 2 nights. Will treat with abx.  - rest, hydrate, Z-pack, tessalon perles.  - encouraged use of mucinex DM - Continue prior regimen started by Dr. Milinda CaveMcGowen.  - F/U 1-2 weeks if not improved.   Reviewed expectations re: course of current medical issues.  Discussed self-management of symptoms.  Outlined signs and symptoms indicating need for more acute intervention.  Patient verbalized understanding and all questions were answered.  Patient received an After-Visit Summary.     Note is dictated utilizing voice recognition software. Although note has been proof read prior to signing, occasional typographical errors still can be missed. If any questions arise, please do not hesitate to call for verification.   electronically signed by:  Felix Pacinienee Yair Dusza, DO  [facility] [place]

## 2016-10-21 ENCOUNTER — Other Ambulatory Visit: Payer: Self-pay | Admitting: Family Medicine

## 2016-10-21 NOTE — Telephone Encounter (Signed)
Pt due for f/u RCI or CPE.   Pt LMOM on 10/21/16 at 9:40am requesting refill.   Pt advised that refill was sent but that he would need to schedule office visit for f/u RCI or CPE for more refills. Pt stated that he will call back to schedule his CPE.

## 2017-01-15 ENCOUNTER — Other Ambulatory Visit: Payer: Self-pay | Admitting: Family Medicine

## 2017-01-17 NOTE — Telephone Encounter (Signed)
Pt was advised on 10/21/16 that he would need office visit for more refills. Pt stated that he would call back to schedule an appointment. No apt has been made.   Will send Rx for #60 w/ 0RF. Pt needs office visit for more refills.

## 2017-01-17 NOTE — Telephone Encounter (Signed)
Pt advised and voiced understanding.  Apt made for 01/17/17 at 8:45am.

## 2017-01-18 ENCOUNTER — Encounter: Payer: Self-pay | Admitting: Family Medicine

## 2017-01-18 ENCOUNTER — Ambulatory Visit (INDEPENDENT_AMBULATORY_CARE_PROVIDER_SITE_OTHER): Payer: 59 | Admitting: Family Medicine

## 2017-01-18 ENCOUNTER — Other Ambulatory Visit: Payer: Self-pay

## 2017-01-18 VITALS — BP 115/79 | HR 74 | Temp 98.0°F | Resp 16 | Ht 68.0 in | Wt 188.8 lb

## 2017-01-18 DIAGNOSIS — J4540 Moderate persistent asthma, uncomplicated: Secondary | ICD-10-CM | POA: Diagnosis not present

## 2017-01-18 DIAGNOSIS — Z91018 Allergy to other foods: Secondary | ICD-10-CM | POA: Diagnosis not present

## 2017-01-18 DIAGNOSIS — J301 Allergic rhinitis due to pollen: Secondary | ICD-10-CM

## 2017-01-18 DIAGNOSIS — M26629 Arthralgia of temporomandibular joint, unspecified side: Secondary | ICD-10-CM | POA: Diagnosis not present

## 2017-01-18 DIAGNOSIS — Z23 Encounter for immunization: Secondary | ICD-10-CM

## 2017-01-18 NOTE — Progress Notes (Signed)
OFFICE VISIT  01/18/2017   CC:  Chief Complaint  Patient presents with  . Follow-up    RCI, pt is fasting.    HPI:    Patient is a 36 y.o.  male who presents for f/u moderate persistent asthma, allergic rhinitis, and insomnia.  I last saw him about 7 mo ago.  [place]ent [place] to [place] and [place]  Asthma: says things are going very good.  Has been compliant with advair, only used albuterol once in the last 1 mo. Sleep is good on the clonidine. Allergies: environmental allergies doing well currently on current med regimen. Some problems with allergies to fruits: tingling in lips/tongue, some swelling of lips.  Watermelon has been a problem for 6 years or so.  Avocado as well.  He does not have an epi pen but he says these reactions have not been progressively worsening and he is pretty good at avoiding these foods. He has never had allergy shots for his allergies.  Right side TMJ flares up pretty frequently, esp when stressed.  Taking advil with flares not helpful.  No swelling or erythema at TMJ on R or L. Mouthguard use hs x 6 mo no help.  Dentist referred him to an oral surgeon in [place] but pt never got a call to set this up. Pt frustrated, asks for referral to local oral surgeon for further evaluation.  Past Medical History:  Diagnosis Date  . Allergic rhinitis    ragweed  . Anxiety    Failed trial of wellbutrin in the past  . Collapsed lung 05/2012   Collapsed RML in the midst of hospitalization for asthma exacerbation with RML pneumonia (pt to f/u with Dr. Marchelle Gearingamaswamy)  . 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 Elevated blood pressure reading without diagnosis of hypertension   . Herpes simplex    genital, recurrent  . Insomnia 10/21/2010  . Moderate persistent asthma   . 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 No pertinent  surgical history.  Outpatient Medications Prior to Visit  Medication Sig Dispense Refill  . acyclovir (ZOVIRAX) 400 MG tablet Take 1 tablet (400 mg total) by mouth 2 (two) times daily. 60 tablet 12  . albuterol (PROAIR HFA) 108 (90 Base) MCG/ACT inhaler Inhale 2 puffs into the lungs every 6 (six) hours as needed for wheezing. 1 Inhaler 1  . cloNIDine (CATAPRES) 0.1 MG tablet TAKE 2 TABLETS BY MOUTH AT BEDTIME 60 tablet 0  . fexofenadine (ALLEGRA) 180 MG tablet Take 1 tablet (180 mg total) by mouth daily. 30 tablet 6  . Fluticasone-Salmeterol (ADVAIR DISKUS) 100-50 MCG/DOSE AEPB Inhale 1 puff into the lungs every 12 (twelve) hours. 60 each 11  . ibuprofen (ADVIL,MOTRIN) 200 MG tablet Take 200-400 mg by mouth every 6 (six) hours as needed for pain, fever or headache.    . triamcinolone (NASACORT) 55 MCG/ACT nasal inhaler Place 2 sprays into the nose daily.    Marland Kitchen. azithromycin (ZITHROMAX Z-PAK) 250 MG tablet 500 mg day 1, then 250 mg QD (Patient not taking: Reported on 01/18/2017) 6 each 0  . benzonatate (TESSALON) 200 MG capsule Take 1 capsule (200 mg total) by mouth 2 (two) times daily as needed for cough. (Patient not taking: Reported on 01/18/2017) 20 capsule 0  . predniSONE (DELTASONE) 20 MG tablet 2 tabs  po qd x 5d, then 1 tab po qd x 5d (Patient not taking: Reported on 01/18/2017) 15 tablet 0   No facility-administered medications prior to visit.     No Known Allergies  ROS As per HPI  PE: Blood pressure 115/79, pulse 74, temperature 98 F (36.7 C), temperature source Oral, resp. rate 16, height 5\' 8"  (1.727 m), weight 188 lb 12 oz (85.6 kg), SpO2 97 %. Gen: Alert, well appearing.  Patient is oriented to person, place, time, and situation. AFFECT: pleasant, lucid thought and speech. ZOX:WRUEENT:Eyes: no injection, icteris, swelling, or exudate.  EOMI, PERRLA. Mouth: lips without lesion/swelling.  Oral mucosa pink and moist. Oropharynx without erythema, exudate, or swelling.  TMJs w/out TTP,  swelling, erythema, or subluxation.  Normal ROM of TMJs. CV: RRR, no m/r/g.   LUNGS: CTA bilat, nonlabored resps, good aeration in all lung fields.   LABS:    Chemistry      Component Value Date/Time   NA 139 11/04/2015 0908   K 4.3 11/04/2015 0908   CL 104 11/04/2015 0908   CO2 29 11/04/2015 0908   BUN 15 11/04/2015 0908   CREATININE 1.00 11/04/2015 0908      Component Value Date/Time   CALCIUM 9.7 11/04/2015 0908   ALKPHOS 44 11/04/2015 0908   AST 17 11/04/2015 0908   ALT 20 11/04/2015 0908   BILITOT 0.9 11/04/2015 0908     Lab Results  Component Value Date   WBC 8.2 11/04/2015   HGB 17.2 (H) 11/04/2015   HCT 50.2 11/04/2015   MCV 90.9 11/04/2015   PLT 240.0 11/04/2015   Lab Results  Component Value Date   TSH 1.80 11/04/2015   Lab Results  Component Value Date   CHOL 210 (H) 11/04/2015   HDL 52.90 11/04/2015   LDLCALC 136 (H) 11/04/2015   TRIG 107.0 11/04/2015   CHOLHDL 4 11/04/2015    IMPRESSION AND PLAN:  1) Right TMJ syndrome: worsening despite use of oral appliance during sleep and advil prn flares. Pt with difficulty getting oral surgeon referral from his dentist. Referred to oral surgeon for further eval/mgmt today.  2) Mod pers asthma: The current medical regimen is effective;  continue present plan and medications. FLu vaccine today.  3) Allergies: environmental allergies well controlled. Some food allergies (fruits) that have been stable for years--no progression of his allergy symptoms from this. Considered referral to allergist to see if immunotherapy an option for him, but in the end he decided not to pursue this today, nor does he think he needs an epi-pen.  4) Insomnia: The current medical regimen is effective;  continue present plan and medications.  An After Visit Summary was printed and given to the patient.   FOLLOW UP: Return in about 6 months (around 07/19/2017) for annual CPE (fasting).  Signed:  Santiago BumpersPhil McGowen, MD            01/18/2017

## 2017-02-01 DIAGNOSIS — T781XXA Other adverse food reactions, not elsewhere classified, initial encounter: Secondary | ICD-10-CM

## 2017-02-01 HISTORY — DX: Other adverse food reactions, not elsewhere classified, initial encounter: T78.1XXA

## 2017-02-16 ENCOUNTER — Other Ambulatory Visit: Payer: Self-pay | Admitting: Family Medicine

## 2017-06-18 ENCOUNTER — Other Ambulatory Visit: Payer: Self-pay | Admitting: Family Medicine

## 2017-07-06 ENCOUNTER — Telehealth: Payer: Self-pay | Admitting: Family Medicine

## 2017-07-06 MED ORDER — CLONIDINE HCL 0.1 MG PO TABS: 0.2000 mg | ORAL_TABLET | Freq: Every day | ORAL | 0 refills | Status: DC

## 2017-07-06 NOTE — Telephone Encounter (Signed)
Rx sent for 90 day supply.  Pt advised and voiced understanding.

## 2017-07-06 NOTE — Telephone Encounter (Signed)
Copied from CRM #[id number]. [id number]: Quick Communication - See Telephone Encounter >> Jul 06, 2017  1:04 PM Diana EvesHoyt, Maryann B wrote: CRM for notification. See Telephone encounter for: 07/06/17.  Pt is needing a 90 day supply of cloNIDine (CATAPRES) 0.1 MG tablet called in to [id number] - [place], [place] - [address] for his insurance to cover it. Pt is running out of medication and is hoping this  can be taken care of today.

## 2017-10-04 ENCOUNTER — Telehealth: Payer: Self-pay | Admitting: Family Medicine

## 2017-10-04 MED ORDER — CLONIDINE HCL 0.1 MG PO TABS: 0.2000 mg | ORAL_TABLET | Freq: Every day | ORAL | 0 refills | Status: DC

## 2017-10-04 NOTE — Telephone Encounter (Signed)
Copied from CRM [id number]. Topic: Quick Communication - Rx Refill/Question >> Oct 04, 2017 11:26 AM Herby Abraham C wrote: Medication: cloNIDine (CATAPRES) 0.1 MG tablet   Has the patient contacted their pharmacy? No  (Agent: If no, request that the patient contact the pharmacy for the refill.) (Agent: If yes, when and what did the pharmacy advise?)  Preferred Pharmacy (with phone number or street name[facility] [address] [place] - [address] [place] [phone] (Phone) [phone] (Fax)    Agent: Please be advised that RX refills may take up to 3 business days. We ask that you follow-up with your pharmacy.

## 2017-10-04 NOTE — Telephone Encounter (Signed)
Pt called and scheduled for CPE on 10/06/17 with Dr. Milinda Cave. Pt states he is currently out of medication. [facility] [address]

## 2017-10-06 ENCOUNTER — Encounter: Payer: Self-pay | Admitting: Family Medicine

## 2017-10-06 ENCOUNTER — Ambulatory Visit (INDEPENDENT_AMBULATORY_CARE_PROVIDER_SITE_OTHER): Payer: No Typology Code available for payment source | Admitting: Family Medicine

## 2017-10-06 VITALS — BP 114/75 | HR 66 | Temp 98.4°F | Resp 16 | Ht 68.0 in | Wt 184.0 lb

## 2017-10-06 DIAGNOSIS — Z0000 Encounter for general adult medical examination without abnormal findings: Secondary | ICD-10-CM | POA: Diagnosis not present

## 2017-10-06 DIAGNOSIS — Z23 Encounter for immunization: Secondary | ICD-10-CM

## 2017-10-06 DIAGNOSIS — J4540 Moderate persistent asthma, uncomplicated: Secondary | ICD-10-CM | POA: Diagnosis not present

## 2017-10-06 DIAGNOSIS — E663 Overweight: Secondary | ICD-10-CM | POA: Diagnosis not present

## 2017-10-06 MED ORDER — CLONIDINE HCL 0.1 MG PO TABS: 0.2000 mg | ORAL_TABLET | Freq: Every day | ORAL | 3 refills | Status: DC

## 2017-10-06 MED ORDER — ACYCLOVIR 400 MG PO TABS: 400.0000 mg | ORAL_TABLET | Freq: Two times a day (BID) | ORAL | 11 refills | Status: DC

## 2017-10-06 NOTE — Progress Notes (Signed)
Office Note 10/06/2017  CC:  Chief Complaint  Patient presents with  . Annual Exam    Pt is fasting.     HPI:  Matthew Petty is a 37 y.o. male with hx of moderate persistent asthma, allergic rhinitis, and insomnia who is here today for health maintenance exam. 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  Traveling a lot. He is based in [place] for work, has not moved his family from [place] at this point in time.  Job is somewhat stressful but he is waiting to see if this improves.  Has TMJ, was going to see an oral surgeon for further eval but has put this off until he figures out health insurance and knows where he will be living.  This has only been bothering him some after/while he runs on treadmill.  He wears mouthpiece/guard when sleeping.   Has not been taking his advair, which has been his usual summer routine.  He plans on restarting advair soon.     Past Medical History:  Diagnosis Date  . Allergic rhinitis    ragweed  . Anxiety    Failed trial of wellbutrin in the past  . Collapsed lung 05/2012   Collapsed RML in the midst of hospitalization for asthma exacerbation with RML pneumonia (pt to f/u with Dr. Marchelle Gearing)  . 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 Elevated blood pressure reading without diagnosis of hypertension   . Herpes simplex    genital, recurrent  . Insomnia 10/21/2010  . Moderate persistent asthma   . 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 No pertinent surgical history.  Family History  Problem Relation Age of Onset  . Cancer Mother        Thyroid: cured (age 31)  . Cancer Paternal Grandfather        Prostate cancer in his 56s  . Heart disease Maternal Grandfather   . Mental illness Maternal Grandfather        bipolar?    Social History   Socioeconomic History  . Marital status: Married    Spouse name: Tresa Endo  .  Number of children: Not on file  . Years of education: Not on file  . Highest education level: Not on file  Occupational History    Employer: [employer]  Social Needs  . Financial resource strain: Not on file  . Food insecurity:    Worry: Not on file    Inability: Not on file  . Transportation needs:    Medical: Not on file    Non-medical: Not on file  Tobacco Use  . Smoking status: Never Smoker  . Smokeless tobacco: Never Used  Substance and Sexual Activity  . Alcohol use: Yes    Comment: weekend  . Drug use: No  . Sexual activity: Not on file  Lifestyle  . Physical activity:    Days per week: Not on file    Minutes per session: Not on file  . Stress: Not on file  Relationships  . Social connections:    Talks on phone: Not on file    Gets together: Not on file    Attends religious service: Not on file    Active member of club or organization: Not on file    Attends meetings of clubs or organizations: Not on file    Relationship  status: Not on file  . Intimate partner violence:    Fear of current or ex partner: Not on file    Emotionally abused: Not on file    Physically abused: Not on file    Forced sexual activity: Not on file  Other Topics Concern  . Not on file  Social History Narrative   Married, has two children.   Moved to [place] from [place] around 2006.   Occupation: Agricultural engineer.   Has postgrad degree in business/finance.   No exercise.   No tobacco or drugs.  Drinks some alcohol on weekends/socially.    Outpatient Medications Prior to Visit  Medication Sig Dispense Refill  . albuterol (PROAIR HFA) 108 (90 Base) MCG/ACT inhaler Inhale 2 puffs into the lungs every 6 (six) hours as needed for wheezing. 1 Inhaler 1  . fexofenadine (ALLEGRA) 180 MG tablet Take 1 tablet (180 mg total) by mouth daily. 30 tablet 6  . Fluticasone-Salmeterol (ADVAIR DISKUS) 100-50 MCG/DOSE AEPB Inhale 1 puff into the lungs every 12 (twelve) hours. 60  each 11  . ibuprofen (ADVIL,MOTRIN) 200 MG tablet Take 200-400 mg by mouth every 6 (six) hours as needed for pain, fever or headache.    . triamcinolone (NASACORT) 55 MCG/ACT nasal inhaler Place 2 sprays into the nose daily.    Marland Kitchen acyclovir (ZOVIRAX) 400 MG tablet Take 1 tablet (400 mg total) by mouth 2 (two) times daily. (Patient taking differently: Take 400 mg by mouth 2 (two) times daily as needed. ) 60 tablet 12  . cloNIDine (CATAPRES) 0.1 MG tablet Take 2 tablets (0.2 mg total) by mouth at bedtime. 60 tablet 0   No facility-administered medications prior to visit.     No Known Allergies  ROS Review of Systems  Constitutional: Negative for appetite change, chills, fatigue and fever.  HENT: Negative for congestion, dental problem, ear pain and sore throat.   Eyes: Negative for discharge, redness and visual disturbance.  Respiratory: Negative for cough, chest tightness, shortness of breath and wheezing.   Cardiovascular: Negative for chest pain, palpitations and leg swelling.  Gastrointestinal: Negative for abdominal pain, blood in stool, diarrhea, nausea and vomiting.  Genitourinary: Negative for difficulty urinating, dysuria, flank pain, frequency, hematuria and urgency.  Musculoskeletal: Negative for arthralgias, back pain, joint swelling, myalgias and neck stiffness.  Skin: Negative for pallor and rash.  Neurological: Negative for dizziness, speech difficulty, weakness and headaches.  Hematological: Negative for adenopathy. Does not bruise/bleed easily.  Psychiatric/Behavioral: Negative for confusion and sleep disturbance. The patient is not nervous/anxious.     PE; Blood pressure 114/75, pulse 66, temperature 98.4 F (36.9 C), temperature source Oral, resp. rate 16, height 5\' 8"  (1.727 m), weight 184 lb (83.5 kg), SpO2 96 %. Body mass index is 27.98 kg/m.  Gen: Alert, well appearing.  Patient is oriented to person, place, time, and situation. AFFECT: pleasant, lucid thought and  speech. ENT: Ears: EACs clear, normal epithelium.  TMs with good light reflex and landmarks bilaterally.  Eyes: no injection, icteris, swelling, or exudate.  EOMI, PERRLA. Nose: no drainage or turbinate edema/swelling.  No injection or focal lesion.  Mouth: lips without lesion/swelling.  Oral mucosa pink and moist.  Dentition intact and without obvious caries or gingival swelling.  Oropharynx without erythema, exudate, or swelling.  Neck: supple/nontender.  No LAD, mass, or TM.  Carotid pulses 2+ bilaterally, without bruits. CV: RRR, no m/r/g.   LUNGS: CTA bilat, nonlabored resps, good aeration in all lung fields. ABD: soft, NT,  ND, BS normal.  No hepatospenomegaly or mass.  No bruits. EXT: no clubbing, cyanosis, or edema.  Musculoskeletal: no joint swelling, erythema, warmth, or tenderness.  ROM of all joints intact. Skin - no sores or suspicious lesions or rashes or color changes   Pertinent labs:  Lab Results  Component Value Date   TSH 1.80 11/04/2015   Lab Results  Component Value Date   WBC 8.2 11/04/2015   HGB 17.2 (H) 11/04/2015   HCT 50.2 11/04/2015   MCV 90.9 11/04/2015   PLT 240.0 11/04/2015   Lab Results  Component Value Date   CREATININE 1.00 11/04/2015   BUN 15 11/04/2015   NA 139 11/04/2015   K 4.3 11/04/2015   CL 104 11/04/2015   CO2 29 11/04/2015   Lab Results  Component Value Date   ALT 20 11/04/2015   AST 17 11/04/2015   ALKPHOS 44 11/04/2015   BILITOT 0.9 11/04/2015   Lab Results  Component Value Date   CHOL 210 (H) 11/04/2015   Lab Results  Component Value Date   HDL 52.90 11/04/2015   Lab Results  Component Value Date   LDLCALC 136 (H) 11/04/2015   Lab Results  Component Value Date   TRIG 107.0 11/04/2015   Lab Results  Component Value Date   CHOLHDL 4 11/04/2015    ASSESSMENT AND PLAN:    1) Health maintenance exam: Reviewed age and gender appropriate health maintenance issues (prudent diet, regular exercise, health risks of  tobacco and excessive alcohol, use of seatbelts, fire alarms in home, use of sunscreen).  Also reviewed age and gender appropriate health screening as well as vaccine recommendations. Vaccines: Tdap due--> given today.  Flu vaccine due-->given today.  Pneumovax recommended (asthma)-->given today. Labs: fasting HP. Prostate ca screening: average risk patient= as per latest guidelines, start screening at 45-50 yrs of age. Colon ca screening: average risk patient= as per latest guidelines, start screening at 45-50 yrs of age.  2) Moderate persistent asthma, with allergic rhinitis: this has been stable.  We discussed ongoing mgmt of this today.  We decided to have him see allergy/imm/asthma specialist--mainly to discuss possible allergy testing AND to get PFTs and expert opinion regarding any changes in management.  An After Visit Summary was printed and given to the patient.  FOLLOW UP:  Return in about 1 year (around 10/07/2018) for annual CPE (fasting).  Signed:  Santiago Bumpers, MD           10/06/2017

## 2017-10-06 NOTE — Patient Instructions (Signed)

## 2017-10-06 NOTE — Addendum Note (Signed)
Addended by: Smitty Knudsen on: 10/06/2017 10:43 AM   Modules accepted: Orders

## 2017-10-07 LAB — COMPREHENSIVE METABOLIC PANEL
AG RATIO: 1.8 (calc) (ref 1.0–2.5)
ALT: 20 U/L (ref 9–46)
AST: 18 U/L (ref 10–40)
Albumin: 4.6 g/dL (ref 3.6–5.1)
Alkaline phosphatase (APISO): 42 U/L (ref 40–115)
BILIRUBIN TOTAL: 1 mg/dL (ref 0.2–1.2)
BUN: 12 mg/dL (ref 7–25)
CALCIUM: 9.9 mg/dL (ref 8.6–10.3)
CO2: 26 mmol/L (ref 20–32)
Chloride: 101 mmol/L (ref 98–110)
Creat: 1.16 mg/dL (ref 0.60–1.35)
Globulin: 2.5 g/dL (calc) (ref 1.9–3.7)
Glucose, Bld: 81 mg/dL (ref 65–99)
Potassium: 4.6 mmol/L (ref 3.5–5.3)
Sodium: 137 mmol/L (ref 135–146)
Total Protein: 7.1 g/dL (ref 6.1–8.1)

## 2017-10-07 LAB — TSH: TSH: 2.97 m[IU]/L (ref 0.40–4.50)

## 2017-10-07 LAB — CBC WITH DIFFERENTIAL/PLATELET
BASOS PCT: 0.8 %
Basophils Absolute: 51 cells/uL (ref 0–200)
EOS ABS: 282 {cells}/uL (ref 15–500)
EOS PCT: 4.4 %
HCT: 47.2 % (ref 38.5–50.0)
HEMOGLOBIN: 16.2 g/dL (ref 13.2–17.1)
Lymphs Abs: 1971 cells/uL (ref 850–3900)
MCH: 30.9 pg (ref 27.0–33.0)
MCHC: 34.3 g/dL (ref 32.0–36.0)
MCV: 90.1 fL (ref 80.0–100.0)
MONOS PCT: 6.8 %
MPV: 11.2 fL (ref 7.5–12.5)
NEUTROS ABS: 3661 {cells}/uL (ref 1500–7800)
Neutrophils Relative %: 57.2 %
Platelets: 242 10*3/uL (ref 140–400)
RBC: 5.24 10*6/uL (ref 4.20–5.80)
RDW: 12.4 % (ref 11.0–15.0)
Total Lymphocyte: 30.8 %
WBC mixed population: 435 cells/uL (ref 200–950)
WBC: 6.4 10*3/uL (ref 3.8–10.8)

## 2017-10-07 LAB — LIPID PANEL
Cholesterol: 223 mg/dL — ABNORMAL HIGH (ref <200)
HDL: 42 mg/dL (ref >40)
LDL CHOLESTEROL (CALC): 155 mg/dL — AB
Non-HDL Cholesterol (Calc): 181 mg/dL (calc) — ABNORMAL HIGH (ref <130)
TRIGLYCERIDES: 140 mg/dL (ref <150)
Total CHOL/HDL Ratio: 5.3 (calc) — ABNORMAL HIGH (ref <5.0)

## 2017-11-16 ENCOUNTER — Ambulatory Visit: Payer: Self-pay | Admitting: Allergy

## 2017-11-16 ENCOUNTER — Encounter: Payer: Self-pay | Admitting: Allergy

## 2017-11-16 VITALS — BP 128/82 | HR 66 | Temp 98.3°F | Resp 18 | Ht 67.5 in | Wt 189.6 lb

## 2017-11-16 DIAGNOSIS — J4540 Moderate persistent asthma, uncomplicated: Secondary | ICD-10-CM | POA: Diagnosis not present

## 2017-11-16 DIAGNOSIS — J301 Allergic rhinitis due to pollen: Secondary | ICD-10-CM

## 2017-11-16 DIAGNOSIS — T781XXD Other adverse food reactions, not elsewhere classified, subsequent encounter: Secondary | ICD-10-CM

## 2017-11-16 DIAGNOSIS — H1013 Acute atopic conjunctivitis, bilateral: Secondary | ICD-10-CM | POA: Diagnosis not present

## 2017-11-16 MED ORDER — AZELASTINE-FLUTICASONE 137-50 MCG/ACT NA SUSP: 1.0000 | Freq: Two times a day (BID) | NASAL | 5 refills | Status: DC

## 2017-11-16 MED ORDER — OLOPATADINE HCL 0.7 % OP SOLN: 1.0000 [drp] | Freq: Every day | OPHTHALMIC | 5 refills | Status: DC | PRN

## 2017-11-16 MED ORDER — LEVOCETIRIZINE DIHYDROCHLORIDE 5 MG PO TABS: 5.0000 mg | ORAL_TABLET | Freq: Every evening | ORAL | 6 refills | Status: DC

## 2017-11-16 NOTE — Progress Notes (Signed)
New Patient Note  RE: [name] MRN: [id number] DOB: 27-Nov-1980 Date of Office Visit: 11/16/2017  Referring provider: Jeoffrey Massed, MD Primary care provider: Jeoffrey Massed, MD  Chief Complaint: allergies and asthma  History of present illness: Matthew Petty is a 37 y.o. male presenting today for consultation for asthma and allergies.    He has allergies.  Symptoms include itchy eyes, fatigue primarily.  But does also increase nasal congestion and drainage worse in the winter and spring.   During spring and fall he doses use OTC Alaway as needed for his itchy eyes with relief.  He takes Careers adviser daily.  He also takes nasacort daily during winter and spring.   He does states he is allergic to cats.  With cat exposure he develops itchy/watery eyes, nasal congestion and drainage. He did undergo allergen immunotherapy as a child.  He father (now deceased) was an Proofreader.     He has a history of asthma diagnosed in childhood.  He takes advair during the winter and spring and takes 1 puff nightly (as he forgets the second dose).   Albuterol use on average 3-4 times/mo. No nighttime awakenings.   Last year he reports having bronchitis and did complete a course of prednisone.   Summer is best time of year for him in regards to his allergies and asthma and does not use advair during this season.   He states he has tried singulair years ago but did not much response and also reports having a side effect.   He has been hospitalized for asthma exacerbation secondary to PNA in 2014.  He did follow-up with Dr. Marchelle Gearing for this at this time.  He states this is the only hospitalization he has had related to his asthma.  He received flu vaccine thru PCP this season.   Over the past several years he reports having issues with some fruits.  He feels though over past 2-3 years symptoms have been worsening and he is noting more and more fruits causing symptoms.  Watermelon is the  worst and he develops tingling of lips and tongue as well as lip swelling.  He has also noted oral cavity symptoms with canteloupe, melons, strawberries, blueberries, avocado.  He states he does avoid watermelon but does not strictly avoid the other fruits.  Denies an N/V/D, cough/wheeze/SOB/chest tightness or any CV related symptoms with food ingestion.   No history of eczema.    Review of systems: Review of Systems  Constitutional: Negative for chills, fever and malaise/fatigue.  HENT: Negative for congestion, ear discharge, nosebleeds and sore throat.   Eyes: Negative for pain, discharge and redness.  Respiratory: Negative for cough, shortness of breath and wheezing.   Cardiovascular: Negative for chest pain.  Gastrointestinal: Negative for abdominal pain, constipation, diarrhea, heartburn, nausea and vomiting.  Musculoskeletal: Negative for joint pain.  Skin: Negative for itching and rash.  Neurological: Negative for headaches.    All other systems negative unless noted above in HPI  Past medical history: Past Medical History:  Diagnosis Date  . Allergic rhinitis    ragweed  . Anxiety    Failed trial of wellbutrin in the past  . Collapsed lung 05/2012   Collapsed RML in the midst of hospitalization for asthma exacerbation with RML pneumonia (pt to f/u with Dr. Marchelle Gearing)  . 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 Elevated  blood pressure reading without diagnosis of hypertension   . Herpes simplex    genital, recurrent  . Insomnia 10/21/2010  . Moderate persistent asthma   . 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 TMJ arthralgia    mouthguard hs helpful    Past surgical history: History reviewed. No pertinent surgical history.  Family history:  Family History  Problem Relation Age of Onset  . Cancer Mother        Thyroid: cured (age 41)  . Cancer Paternal Grandfather         Prostate cancer in his 23s  . Heart disease Maternal Grandfather   . Mental illness Maternal Grandfather        bipolar?    Social history: Lives in a home without carpeting with gas and electric heating and central cooling.  1 dog in the home.  No concern for water damage, mildew or roaches in the home.  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  Denies smoking history.    Medication List: Allergies as of 11/16/2017   No Known Allergies     Medication List        Accurate as of 11/16/17 12:14 PM. Always use your most recent med list.          acyclovir 400 MG tablet Commonly known as:  ZOVIRAX Take 1 tablet (400 mg total) by mouth 2 (two) times daily.   albuterol 108 (90 Base) MCG/ACT inhaler Commonly known as:  PROVENTIL HFA;VENTOLIN HFA Inhale 2 puffs into the lungs every 6 (six) hours as needed for wheezing.   cloNIDine 0.1 MG tablet Commonly known as:  CATAPRES Take 2 tablets (0.2 mg total) by mouth at bedtime.   fexofenadine 180 MG tablet Commonly known as:  ALLEGRA Take 1 tablet (180 mg total) by mouth daily.   Fluticasone-Salmeterol 100-50 MCG/DOSE Aepb Commonly known as:  ADVAIR Inhale 1 puff into the lungs every 12 (twelve) hours.   ibuprofen 200 MG tablet Commonly known as:  ADVIL,MOTRIN Take 200-400 mg by mouth every 6 (six) hours as needed for pain, fever or headache.   triamcinolone 55 MCG/ACT nasal inhaler Commonly known as:  NASACORT Place 2 sprays into the nose daily.       Known medication allergies: No Known Allergies   Physical examination: Blood pressure 128/82, pulse 66, temperature 98.3 F (36.8 C), temperature source Oral, resp. rate 18, height 5' 7.5" (1.715 m), weight 189 lb 9.6 oz (86 kg), SpO2 97 %.  General: Alert, interactive, in no acute distress. HEENT: PERRLA, TMs pearly gray, turbinates minimally edematous without discharge, post-pharynx non erythematous. Neck: Supple without  lymphadenopathy. Lungs: Clear to auscultation without wheezing, rhonchi or rales. {no increased work of breathing. CV: Normal S1, S2 without murmurs. Abdomen: Nondistended, nontender. Skin: Warm and dry, without lesions or rashes. Extremities:  No clubbing, cyanosis or edema. Neuro:   Grossly intact.  Diagnositics/Labs:  Spirometry: FEV1: 3.64L 92%, FVC: 4.48L 93%, ratio consistent with nonobstructive pattern  Allergy testing: environmental allergy skin prick testing is positive to grasses, weeds, trees, dust mites, cat Select food allergy skin prick testing is negative to watermelon, avocado, strawberry, canteloupe Allergy testing results were read and interpreted by provider, documented by clinical staff.  Assessment and plan:   Allergic rhinitis and conjunctivitis  - environmental allergy skin testing today is positive for grasses, weeds, trees, dust mites, cat  - allergen avoidance measures discussed/handouts provided  -  trial Xyzal 5mg  daily to see if this is more effective than Allegra  - trial dymista 1 spray each nostril twice a day.  This is a combination nasal spray with Flonase + Astelin (nasal antihistamine).  This helps with both nasal congestion and drainage.   Hold Nasacort while using Dymista.   - continue use of Alaway 1 drop each eye up to twice a day as needed or Pazeo 1 drop each eye daily as needed(sample provided)  - allergen immunotherapy discussed today including protocol, benefits and risk.  Informational handout provided.  If interested in this therapuetic option you can check with your insurance carrier for coverage.  Let us know if you would like to proceed with this option.  Immunotherapy likely will help with OAS (see below) as well.   Asthma, mod persistent  - have access to albuterol inhaler 2 puffs every 4-6 hours as needed for cough/wheeze/shortness of breath/chest tightness.  May use 15-20 minutes prior to activity.   Monitor frequency of use.   -  continue use of Advair 100/50 1 puff twice a day during winter/fall - your problematic seasons.  If not meeting below goals then make Advair a daily maintenance medication   - will avoid singulair due to lack of response and side effects   Asthma control goals:   Full participation in all desired activities (may need albuterol before activity)  Albuterol use two time or less a week on average (not counting use with activity)  Cough interfering with sleep two time or less a month  Oral steroids no more than once a year  No hospitalizations   Oral allergy syndrome  - The oral allergy syndrome (OAS) or pollen-food allergy syndrome (PFAS) is a relatively common form of food allergy, particularly in adults. It typically occurs in people who have pollen allergies when the immune system "sees" proteins on the food that look like proteins on the pollen. This results in the allergy antibody (IgE) binding to the food instead of the pollen. Patients typically report itching and/or mild swelling of the mouth and throat immediately following ingestion of certain uncooked fruits (including nuts) or raw vegetables. Only a very small number of affected individuals experience systemic allergic reactions, such as anaphylaxis which occurs with true food allergies.  Thus due to rare conversion to anaphylaxis he does not warrant epipen at this time.  Recommend he avoid these fruits to avoid development of oral cavity symptoms.  - skin testing to fruits are negative today   Follow-up 6 months or sooner if needed  I appreciate the opportunity to take part in Constance's care. Please do not hesitate to contact me with questions.  Sincerely,   Margo Aye, MD Allergy/Immunology [facility]

## 2017-11-16 NOTE — Patient Instructions (Addendum)
Allergies  - environmental allergy skin testing today is positive for grasses, weeds, trees, dust mites, cat  - allergen avoidance measures discussed/handouts provide  - trial Xyzal 5mg  daily to see if this is more effective than Allegra  - trial dymista 1 spray each nostril twice a day.  This is a combination nasal spray with Flonase + Astelin (nasal antihistamine).  This helps with both nasal congestion and drainage.   Hold Nasacort while using Dymista.   - continue use of Alaway 1 drop each eye up to twice a day as needed or Pazeo 1 drop each eye daily as needed(sample provided)  - allergen immunotherapy discussed today including protocol, benefits and risk.  Informational handout provided.  If interested in this therapuetic option you can check with your insurance carrier for coverage.  Let us know if you would like to proceed with this option.  Immunotherapy likely will help with OAS (see below)  Asthma  - have access to albuterol inhaler 2 puffs every 4-6 hours as needed for cough/wheeze/shortness of breath/chest tightness.  May use 15-20 minutes prior to activity.   Monitor frequency of use.   - continue use of Advair 100/50 1 puff twice a day during winter/fall - your problematic seasons.  If not meeting below goals then make Advair a daily maintenance medication    Asthma control goals:   Full participation in all desired activities (may need albuterol before activity)  Albuterol use two time or less a week on average (not counting use with activity)  Cough interfering with sleep two time or less a month  Oral steroids no more than once a year  No hospitalizations   Oral allergy syndrome  - The oral allergy syndrome (OAS) or pollen-food allergy syndrome (PFAS) is a relatively common form of food allergy, particularly in adults. It typically occurs in people who have pollen allergies when the immune system "sees" proteins on the food that look like proteins on the pollen. This  results in the allergy antibody (IgE) binding to the food instead of the pollen. Patients typically report itching and/or mild swelling of the mouth and throat immediately following ingestion of certain uncooked fruits (including nuts) or raw vegetables. Only a very small number of affected individuals experience systemic allergic reactions, such as anaphylaxis which occurs with true food allergies.   - skin testing to fruits are negative today   Follow-up 6 months or sooner if needed

## 2017-12-20 ENCOUNTER — Encounter: Payer: Self-pay | Admitting: Family Medicine

## 2018-01-30 ENCOUNTER — Ambulatory Visit: Payer: No Typology Code available for payment source | Admitting: Family Medicine

## 2018-01-30 ENCOUNTER — Other Ambulatory Visit: Payer: Self-pay

## 2018-01-30 ENCOUNTER — Encounter: Payer: Self-pay | Admitting: Family Medicine

## 2018-01-30 VITALS — BP 115/73 | HR 75 | Temp 98.0°F | Resp 16 | Ht 67.5 in | Wt 184.2 lb

## 2018-01-30 DIAGNOSIS — R05 Cough: Secondary | ICD-10-CM

## 2018-01-30 DIAGNOSIS — R509 Fever, unspecified: Secondary | ICD-10-CM

## 2018-01-30 DIAGNOSIS — J209 Acute bronchitis, unspecified: Secondary | ICD-10-CM | POA: Diagnosis not present

## 2018-01-30 DIAGNOSIS — R059 Cough, unspecified: Secondary | ICD-10-CM

## 2018-01-30 NOTE — Progress Notes (Signed)
OFFICE VISIT  01/30/2018   CC:  Chief Complaint  Patient presents with  . Cough   HPI:    Patient is a 37 y.o.  male with moderate persistent asthma who presents for cough. [place]. About 12 d/a pt got subjective fevers/chills x 3d, got cough.  No nasal congestion, runny nose, sneezing, or ST. Some sinus pressure intermittently.  No wheezing or SOB, but he has been compliant with advair hs dose and also takes albut at that time.  No n/v/d.  Past Medical History:  Diagnosis Date  . Allergic rhinitis    ragweed  . Anxiety    Failed trial of wellbutrin in the past  . Collapsed lung 05/2012   Collapsed RML in the midst of hospitalization for asthma exacerbation with RML pneumonia (pt to f/u with Dr. Marchelle Gearingamaswamy)  . 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 Elevated blood pressure reading without diagnosis of hypertension   . Herpes simplex    genital, recurrent  . Insomnia 10/21/2010  . Moderate persistent asthma   . Oral allergy syndrome 2019   oral allergy syndrome (OAS) or pollen-food allergy syndrome (PFAS)-->allergist-->food allergy testing NEG.    . 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 TMJ arthralgia    mouthguard hs helpful    History reviewed. No pertinent surgical history.  Outpatient Medications Prior to Visit  Medication Sig Dispense Refill  . acyclovir (ZOVIRAX) 400 MG tablet Take 1 tablet (400 mg total) by mouth 2 (two) times daily. 60 tablet 11  . albuterol (PROAIR HFA) 108 (90 Base) MCG/ACT inhaler Inhale 2 puffs into the lungs every 6 (six) hours as needed for wheezing. 1 Inhaler 1  . Azelastine-Fluticasone 137-50 MCG/ACT SUSP Place 1 spray into both nostrils 2 (two) times daily. 23 g 5  . cloNIDine (CATAPRES) 0.1 MG tablet Take 2 tablets (0.2 mg total) by mouth at bedtime. 180 tablet 3  . fexofenadine (ALLEGRA)  180 MG tablet Take 1 tablet (180 mg total) by mouth daily. 30 tablet 6  . Fluticasone-Salmeterol (ADVAIR DISKUS) 100-50 MCG/DOSE AEPB Inhale 1 puff into the lungs every 12 (twelve) hours. 60 each 11  . ibuprofen (ADVIL,MOTRIN) 200 MG tablet Take 200-400 mg by mouth every 6 (six) hours as needed for pain, fever or headache.    . levocetirizine (XYZAL) 5 MG tablet Take 1 tablet (5 mg total) by mouth every evening. (Patient not taking: Reported on 01/30/2018) 30 tablet 6  . Olopatadine HCl (PAZEO) 0.7 % SOLN Place 1 drop into both eyes daily as needed. (Patient not taking: Reported on 01/30/2018) 1 Bottle 5  . triamcinolone (NASACORT) 55 MCG/ACT nasal inhaler Place 2 sprays into the nose daily.     No facility-administered medications prior to visit.     No Known Allergies  ROS As per HPI  PE: Blood pressure 115/73, pulse 75, temperature 98 F (36.7 C), temperature source Oral, resp. rate 16, height 5' 7.5" (1.715 m), weight 184 lb 4 oz (83.6 kg), SpO2 98 %. Gen: Alert, well appearing.  Patient is oriented to person, place, time, and situation. AFFECT: pleasant, lucid thought and speech. WUJ:WJXBENT:Eyes: no injection, icteris, swelling, or exudate.  EOMI, PERRLA. Mouth: lips without lesion/swelling.  Oral mucosa pink and moist. Oropharynx without erythema, exudate, or swelling.  Neck - No masses or  thyromegaly or limitation in range of motion CV: RRR, no m/r/g.   LUNGS: CTA bilat, nonlabored resps, good aeration in all lung fields. EXT: no clubbing or cyanosis.  no edema.    LABS:    Chemistry      Component Value Date/Time   NA 137 10/06/2017 1038   K 4.6 10/06/2017 1038   CL 101 10/06/2017 1038   CO2 26 10/06/2017 1038   BUN 12 10/06/2017 1038   CREATININE 1.16 10/06/2017 1038      Component Value Date/Time   CALCIUM 9.9 10/06/2017 1038   ALKPHOS 44 11/04/2015 0908   AST 18 10/06/2017 1038   ALT 20 10/06/2017 1038   BILITOT 1.0 10/06/2017 1038      IMPRESSION AND  PLAN:  Cough, suspect this is lingering from acute viral bronchitis. No sign of bacterial infection or acute asthma flare. He can try otc cough med, otherwise continue scheduled asthma inhaler and prn albuterol. Signs/symptoms to call or return for were reviewed and pt expressed understanding.  An After Visit Summary was printed and given to the patient.  FOLLOW UP: Return if symptoms worsen or fail to improve.  Signed:  Santiago BumpersPhil McGowen, MD           01/30/2018

## 2018-04-13 ENCOUNTER — Telehealth: Payer: Self-pay | Admitting: Family Medicine

## 2018-04-13 ENCOUNTER — Ambulatory Visit (INDEPENDENT_AMBULATORY_CARE_PROVIDER_SITE_OTHER): Payer: No Typology Code available for payment source | Admitting: Family Medicine

## 2018-04-13 ENCOUNTER — Encounter: Payer: Self-pay | Admitting: Family Medicine

## 2018-04-13 VITALS — BP 131/85 | HR 74 | Temp 98.1°F | Resp 16 | Ht 67.5 in | Wt 186.0 lb

## 2018-04-13 DIAGNOSIS — S20211A Contusion of right front wall of thorax, initial encounter: Secondary | ICD-10-CM

## 2018-04-13 MED ORDER — ALBUTEROL SULFATE HFA 108 (90 BASE) MCG/ACT IN AERS: 2.0000 | INHALATION_SPRAY | Freq: Four times a day (QID) | RESPIRATORY_TRACT | 6 refills | Status: DC | PRN

## 2018-04-13 MED ORDER — FLUTICASONE-SALMETEROL 100-50 MCG/DOSE IN AEPB: 1.0000 | INHALATION_SPRAY | Freq: Two times a day (BID) | RESPIRATORY_TRACT | 6 refills | Status: DC
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
## 2018-04-13 NOTE — Telephone Encounter (Signed)
[facility] has called about patient waiting at pharmacy and meds are not there. He was just seen by Dr. Milinda Cave.  He told [facility] rep that there should be 3 prescriptions.   This may be a duplicated request due to [facility] center calling a few times while clinical staff and provider not available at this time.  Thank you

## 2018-04-13 NOTE — Addendum Note (Signed)
Addended by: Eulah Pont on: 04/13/2018 12:29 PM   Modules accepted: Orders

## 2018-04-13 NOTE — Progress Notes (Signed)
OFFICE VISIT  04/13/2018   CC:  Chief Complaint  Patient presents with  . Rib pain    right side x 1 week     HPI:    Patient is a 38 y.o.  male with a history of right middle lobe pneumothorax in 2014 who presents for pain in R sided ribs x 1 week.  One week ago he was climbing into boat after snorkeling, hurt some in R anterolateral ribs but not bad. Started hurting more as the days have gone on, worse sleeping on back and R side. No bruising noted.  No SOB but deep inhalations make pain come.  No cough or wheeze.  Taking 3 advil twice a day since it happened.   Applied ice the first 3 days.  Eating and drinking well.   No n/v/d/melena/or hematochezia.    Past Medical History:  Diagnosis Date  . Allergic rhinitis    ragweed  . Anxiety    Failed trial of wellbutrin in the past  . Collapsed lung 05/2012   Collapsed RML in the midst of hospitalization for asthma exacerbation with RML pneumonia (pt to f/u with Dr. Marchelle Gearing)  . 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 Elevated blood pressure reading without diagnosis of hypertension   . Herpes simplex    genital, recurrent  . Insomnia 10/21/2010  . Moderate persistent asthma   . Oral allergy syndrome 2019   oral allergy syndrome (OAS) or pollen-food allergy syndrome (PFAS)-->allergist-->food allergy testing NEG.    . 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 TMJ arthralgia    mouthguard hs helpful    History reviewed. No pertinent surgical history.  Outpatient Medications Prior to Visit  Medication Sig Dispense Refill  . acyclovir (ZOVIRAX) 400 MG tablet Take 1 tablet (400 mg total) by mouth 2 (two) times daily. 60 tablet 11  . albuterol (PROAIR HFA) 108 (90 Base) MCG/ACT inhaler Inhale 2 puffs into the lungs every 6 (six) hours as needed for wheezing. 1 Inhaler 1  . Azelastine-Fluticasone 137-50 MCG/ACT SUSP Place 1 spray into  both nostrils 2 (two) times daily. 23 g 5  . cloNIDine (CATAPRES) 0.1 MG tablet Take 2 tablets (0.2 mg total) by mouth at bedtime. 180 tablet 3  . fexofenadine (ALLEGRA) 180 MG tablet Take 1 tablet (180 mg total) by mouth daily. 30 tablet 6  . Fluticasone-Salmeterol (ADVAIR DISKUS) 100-50 MCG/DOSE AEPB Inhale 1 puff into the lungs every 12 (twelve) hours. 60 each 11  . ibuprofen (ADVIL,MOTRIN) 200 MG tablet Take 200-400 mg by mouth every 6 (six) hours as needed for pain, fever or headache.     No facility-administered medications prior to visit.     No Known Allergies  ROS As per HPI  PE: Blood pressure 131/85, pulse 74, temperature 98.1 F (36.7 C), temperature source Oral, resp. rate 16, height 5' 7.5" (1.715 m), weight 186 lb (84.4 kg), SpO2 97 %. Gen: Alert, well appearing.  Patient is oriented to person, place, time, and situation. AFFECT: pleasant, lucid thought and speech. CV: RRR, no m/r/g.   LUNGS: CTA bilat, nonlabored resps, good aeration in all lung fields. No chest wall or abd wall asymmetry. TTP --moderate---along posterolateral aspect of R ribs No ecchymoses or hematoma.  Mild RUQ TTP w/out guarding or rebound.  No organomegaly. SKIN: no pallor or jaundice.  LABS:  Chemistry      Component Value Date/Time   NA 137 10/06/2017 1038   K 4.6 10/06/2017 1038   CL 101 10/06/2017 1038   CO2 26 10/06/2017 1038   BUN 12 10/06/2017 1038   CREATININE 1.16 10/06/2017 1038      Component Value Date/Time   CALCIUM 9.9 10/06/2017 1038   ALKPHOS 44 11/04/2015 0908   AST 18 10/06/2017 1038   ALT 20 10/06/2017 1038   BILITOT 1.0 10/06/2017 1038      IMPRESSION AND PLAN:  Right chest wall/ribs contusion: discussed option of obtaining rib x-ray to r/o fx but pt decided against this today. Signs/symptoms to call or return for were reviewed and pt expressed understanding.. Take motrin 600mg  bid with food for 3 more days. I rx'd tramadol 50mg , 1-2 bid prn, #12 for more  severe pain.  An After Visit Summary was printed and given to the patient.  FOLLOW UP: No follow-ups on file.  Signed:  Santiago Bumpers, MD           04/13/2018

## 2018-10-24 ENCOUNTER — Other Ambulatory Visit: Payer: Self-pay | Admitting: Family Medicine

## 2018-10-25 ENCOUNTER — Other Ambulatory Visit: Payer: Self-pay

## 2018-10-25 ENCOUNTER — Telehealth: Payer: Self-pay

## 2018-10-25 MED ORDER — ACYCLOVIR 400 MG PO TABS: 400.0000 mg | ORAL_TABLET | Freq: Two times a day (BID) | ORAL | 11 refills | Status: DC

## 2018-10-25 NOTE — Telephone Encounter (Signed)
Patient called requesting refills for 2 of his medications, Clonidine and Acyclovir. 90 day supply sent. Pt is due for CPE, last one was 10/06/17.

## 2019-01-12 ENCOUNTER — Other Ambulatory Visit: Payer: Self-pay | Admitting: Family Medicine

## 2019-03-31 ENCOUNTER — Other Ambulatory Visit: Payer: Self-pay | Admitting: Family Medicine

## 2019-04-24 ENCOUNTER — Other Ambulatory Visit: Payer: Self-pay

## 2019-04-24 ENCOUNTER — Telehealth: Payer: Self-pay

## 2019-04-24 MED ORDER — CLONIDINE HCL 0.1 MG PO TABS: 0.2000 mg | ORAL_TABLET | Freq: Every day | ORAL | 0 refills | Status: DC

## 2019-04-24 NOTE — Telephone Encounter (Signed)
30 day supply RF sent.

## 2019-04-24 NOTE — Telephone Encounter (Signed)
Patient request refill.  Patient scheduled virtual visit on 3/25. He said he will make an appt to come see Dr. Milinda Cave when he gets his COVID vaccine completed.   cloNIDine (CATAPRES) 0.1 MG tablet [[id number]  [id number] - [place], [place] - [address]

## 2019-04-26 ENCOUNTER — Encounter: Payer: Self-pay | Admitting: Family Medicine

## 2019-04-26 ENCOUNTER — Ambulatory Visit (INDEPENDENT_AMBULATORY_CARE_PROVIDER_SITE_OTHER): Payer: No Typology Code available for payment source | Admitting: Family Medicine

## 2019-04-26 ENCOUNTER — Other Ambulatory Visit: Payer: Self-pay

## 2019-04-26 VITALS — Temp 98.4°F

## 2019-04-26 DIAGNOSIS — F5101 Primary insomnia: Secondary | ICD-10-CM | POA: Diagnosis not present

## 2019-04-26 DIAGNOSIS — J45998 Other asthma: Secondary | ICD-10-CM | POA: Diagnosis not present

## 2019-04-26 DIAGNOSIS — J309 Allergic rhinitis, unspecified: Secondary | ICD-10-CM

## 2019-04-26 DIAGNOSIS — S76211A Strain of adductor muscle, fascia and tendon of right thigh, initial encounter: Secondary | ICD-10-CM

## 2019-04-26 MED ORDER — LEVOCETIRIZINE DIHYDROCHLORIDE 5 MG PO TABS: 5.0000 mg | ORAL_TABLET | Freq: Every evening | ORAL | 3 refills | Status: DC

## 2019-04-26 MED ORDER — AZELASTINE-FLUTICASONE 137-50 MCG/ACT NA SUSP: 1.0000 | Freq: Two times a day (BID) | NASAL | 11 refills | Status: DC

## 2019-04-26 MED ORDER — CLONIDINE HCL 0.1 MG PO TABS: 0.2000 mg | ORAL_TABLET | Freq: Every day | ORAL | 3 refills | Status: DC

## 2019-04-26 MED ORDER — ALBUTEROL SULFATE HFA 108 (90 BASE) MCG/ACT IN AERS: 2.0000 | INHALATION_SPRAY | Freq: Four times a day (QID) | RESPIRATORY_TRACT | 3 refills | Status: DC | PRN

## 2019-04-26 MED ORDER — FLUTICASONE-SALMETEROL 250-50 MCG/DOSE IN AEPB: 1.0000 | INHALATION_SPRAY | Freq: Two times a day (BID) | RESPIRATORY_TRACT | 3 refills | Status: DC

## 2019-04-26 NOTE — Patient Instructions (Signed)
Adductor Muscle Strain With Rehab Ask your health care provider which exercises are safe for you. Do exercises exactly as told by your health care provider and adjust them as directed. It is normal to feel mild stretching, pulling, tightness, or mild discomfort as you do these exercises. Stop right away if you feel sudden pain or your pain gets worse. Do not begin these exercises until told by your health care provider. Strengthening exercises These exercises build strength and endurance in your thighs. Endurance is the ability to use your muscles for a long time, even after your muscles get tired. Hip adductor isometrics  This exercise is sometimes called inner thigh squeeze. 1. Sit on a firm chair that positions your knees at about the same height as your hips. 2. Place a large ball, firm pillow, or rolled-up bath towel between your thighs. 3. Squeeze your thighs together, gradually building tension. 4. Hold for __________ seconds. 5. Release the tension gradually. Allow your inner thigh muscles to relax completely before you start the next repetition. Repeat __________ times. Complete this exercise __________ times a day. Hip adduction  This exercise is sometimes called side lying straight leg raises. 1. Lie on your side so your head, shoulder, knee, and hip are in a straight line with each other. To help maintain your balance, you may put the foot of your top leg in front of the leg that is on the floor. Your left / right leg should be on the bottom. 2. Roll your hips slightly forward so your hips are stacked directly over each other and your left / right knee is facing forward. 3. Tense the muscles of your inner thigh and lift your bottom leg 4-6 inches (10-15 cm). 4. Hold this position for __________ seconds. 5. Slowly lower your leg to the starting position. 6. Allow your muscles to relax completely before you start the next repetition. Repeat __________ times. Complete this exercise  __________ times a day. Hip extension  This exercise is sometimes called prone (on your belly) straight leg raises. 1. Lie on your belly on a bed or a firm surface with a pillow under your hips. 2. Tense your buttock muscles and lift your left / right thigh off the bed. Your left / right knee can be bent or straight, but do not let your back arch. 3. Hold this position for __________ seconds. 4. Slowly return to the starting position. 5. Allow your muscles to relax completely before you start the next repetition. Repeat __________ times. Complete this exercise __________ times a day. Balance exercises These exercises improve or maintain your balance. Balance is important in preventing falls. Single-leg balance 1. Stand near a railing or by a door frame that you can hold onto as needed. 2. Stand on your left / right foot. Keep your big toe down on the floor and try to keep your arch lifted. 3. If this is too easy, you can stand with your eyes closed or stand on a pillow. 4. Hold this position for __________ seconds. Repeat __________ times. Complete this exercise __________ times a day. Side lunges 1. Stand with your feet together. 2. Keeping one foot in place, step to the side with your other foot about __________ inches (__________ cm). Do not step so far that you feel discomfort in your middle thigh. 3. Push off from your stepping foot to return to the starting position. Repeat __________ times. Complete this exercise __________ times a day. This information is not intended to replace   advice given to you by your health care provider. Make sure you discuss any questions you have with your health care provider. Document Revised: 05/09/2018 Document Reviewed: 10/18/2017 Elsevier Patient Education  2020 Elsevier Inc.  

## 2019-04-26 NOTE — Progress Notes (Signed)
Virtual Visit via Video Note  I connected with pt on 04/26/19 at  1:30 PM EDT by a video enabled telemedicine application and verified that I am speaking with the correct person using two identifiers.  Location patient: home Location provider:work or home office Persons participating in the virtual visit: patient, provider  I discussed the limitations of evaluation and management by telemedicine and the availability of in person appointments. The patient expressed understanding and agreed to proceed.  Telemedicine visit is a necessity given the COVID-19 restrictions in place at the current time.  HPI: 39 y/o WM being seen today for f/u moderate persistent asthma, allergic rhinitis, and insomnia. Feeling ok but "allergies have been terrible since last year". He had to stop exercising b/c too busy w/work, but prior to that he never returned to good exercising b/c of severe allergy sx's and triggering of asthma symptoms---particularly with mountain biking.  Premedicates with albuterol Using flonase daily, NOT dimysta that is listed in his EMR.  Has been on allegra long term.  Zyrtec causes too much sedation.  Claritin helped a little in the past.  Has never tried xyzal.  Tried singulair "about 15 yrs ago" and does not recall if it helped or not.   Insomnia: clonidine nightly continues to help great, 0.2mg  dose.  Has R groin strain/pain since running too hard 11/2018.  Has rested but done nothing else, says sx's improving a little lately since he started stationary biking. No stretching at home.  No heat or ice application.  No NSAID or tylenol use.  ROS: no fevers, no CP, no dizziness, no HAs, no rashes, no melena/hematochezia.  No polyuria or polydipsia.  No arthralgias.  No focal weakness, paresthesias, or tremors.  No acute vision or hearing abnormalities. No n/v/d or abd pain.  No palpitations.     Past Medical History:  Diagnosis Date  . Allergic rhinitis    ragweed  . Anxiety    Failed trial of wellbutrin in the past  . Collapsed lung 05/2012   Collapsed RML in the midst of hospitalization for asthma exacerbation with RML pneumonia (pt to f/u with Dr. Marchelle Petty)  . 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 Elevated blood pressure reading without diagnosis of hypertension   . Herpes simplex    genital, recurrent  . Insomnia 10/21/2010  . Moderate persistent asthma   . Oral allergy syndrome 2019   oral allergy syndrome (OAS) or pollen-food allergy syndrome (PFAS)-->allergist-->food allergy testing NEG.    . 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 TMJ arthralgia    mouthguard hs helpful    No past surgical history on file.  Family History  Problem Relation Age of Onset  . Cancer Mother        Thyroid: cured (age 74)  . Cancer Paternal Grandfather        Prostate cancer in his 64s  . Heart disease Maternal Grandfather   . Mental illness Maternal Grandfather        bipolar?    SOCIAL HX:  Social History   Socioeconomic History  . Marital status: Married    Spouse name: Matthew Petty  . Number of children: Not on file  . Years of education: Not on file  . Highest education level: Not on file  Occupational History    Employer: [employer]  Tobacco Use  .  Smoking status: Never Smoker  . Smokeless tobacco: Never Used  Substance and Sexual Activity  . Alcohol use: Yes    Comment: weekend  . Drug use: No  . Sexual activity: Not on file  Other Topics Concern  . Not on file  Social History Narrative   Married, has two children.   Moved to [place] from [place] around 2006.   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   Has postgrad degree in business/finance.   No exercise.   No tobacco or drugs.  Drinks some alcohol on weekends/socially.   Social Determinants of Health   Financial Resource Strain:   . Difficulty of Paying Living  Expenses:   Food Insecurity:   . Worried About Charity fundraiser in the Last Year:   . Arboriculturist in the Last Year:   Transportation Needs:   . Film/video editor (Medical):   Marland Kitchen Lack of Transportation (Non-Medical):   Physical Activity:   . Days of Exercise per Week:   . Minutes of Exercise per Session:   Stress:   . Feeling of Stress :   Social Connections:   . Frequency of Communication with Friends and Family:   . Frequency of Social Gatherings with Friends and Family:   . Attends Religious Services:   . Active Member of Clubs or Organizations:   . Attends Archivist Meetings:   Marland Kitchen Marital Status:       Current Outpatient Medications:  .  acyclovir (ZOVIRAX) 400 MG tablet, Take 1 tablet (400 mg total) by mouth 2 (two) times daily., Disp: 60 tablet, Rfl: 11 .  albuterol (PROAIR HFA) 108 (90 Base) MCG/ACT inhaler, Inhale 2 puffs into the lungs every 6 (six) hours as needed for wheezing., Disp: 1 Inhaler, Rfl: 6 .  Azelastine-Fluticasone 137-50 MCG/ACT SUSP, Place 1 spray into both nostrils 2 (two) times daily., Disp: 23 g, Rfl: 5 .  cloNIDine (CATAPRES) 0.1 MG tablet, Take 2 tablets (0.2 mg total) by mouth at bedtime., Disp: 60 tablet, Rfl: 0 .  fexofenadine (ALLEGRA) 180 MG tablet, Take 1 tablet (180 mg total) by mouth daily., Disp: 30 tablet, Rfl: 6 .  Fluticasone-Salmeterol (ADVAIR DISKUS) 100-50 MCG/DOSE AEPB, Inhale 1 puff into the lungs every 12 (twelve) hours., Disp: 60 each, Rfl: 6 .  ibuprofen (ADVIL,MOTRIN) 200 MG tablet, Take 200-400 mg by mouth every 6 (six) hours as needed for pain, fever or headache., Disp: , Rfl:   EXAM:  VITALS per patient if applicable: Temp 78.4 F (36.9 C) (Oral)    GENERAL: alert, oriented, appears well and in no acute distress  HEENT: atraumatic, conjunttiva clear, no obvious abnormalities on inspection of external nose and ears  NECK: normal movements of the head and neck  LUNGS: on inspection no signs of  respiratory distress, breathing rate appears normal, no obvious gross SOB, gasping or wheezing  CV: no obvious cyanosis  MS: moves all visible extremities without noticeable abnormality  PSYCH/NEURO: pleasant and cooperative, no obvious depression or anxiety, speech and thought processing grossly intact  LABS: none today    Chemistry      Component Value Date/Time   NA 137 10/06/2017 1038   K 4.6 10/06/2017 1038   CL 101 10/06/2017 1038   CO2 26 10/06/2017 1038   BUN 12 10/06/2017 1038   CREATININE 1.16 10/06/2017 1038      Component Value Date/Time   CALCIUM 9.9 10/06/2017 1038   ALKPHOS 44 11/04/2015 0908   AST 18  10/06/2017 1038   ALT 20 10/06/2017 1038   BILITOT 1.0 10/06/2017 1038     Lab Results  Component Value Date   WBC 6.4 10/06/2017   HGB 16.2 10/06/2017   HCT 47.2 10/06/2017   MCV 90.1 10/06/2017   PLT 242 10/06/2017   Lab Results  Component Value Date   TSH 2.97 10/06/2017   Lab Results  Component Value Date   CHOL 223 (H) 10/06/2017   HDL 42 10/06/2017   LDLCALC 155 (H) 10/06/2017   TRIG 140 10/06/2017   CHOLHDL 5.3 (H) 10/06/2017    ASSESSMENT AND PLAN:  Discussed the following assessment and plan:  1)allergic rhinitis and mod pers asthma-->poor control. Change allergra to trial of xyzal. Change flonase to dymista trial. Increase advair to 250/50 1 bid dosing. Continue pre-exercise albuterol AND q4h prn use for wheezing.   2) Insomnia: doing great on clonidine every night long term.  3) Right groin strain: discussed alteration in exercise regimen, will mail home rehab/stretching exercises, recommended aleve 440mg  bid x 7d with food, apply heat when resting and apply ice after exercise.  I discussed the assessment and treatment plan with the patient. The patient was provided an opportunity to ask questions and all were answered. The patient agreed with the plan and demonstrated an understanding of the instructions.   The patient was  advised to call back or seek an in-person evaluation if the symptoms worsen or if the condition fails to improve as anticipated.   F/u: 1 mo cpe  Signed:  , MD           04/26/2019

## 2019-04-27 ENCOUNTER — Other Ambulatory Visit: Payer: Self-pay | Admitting: Family Medicine

## 2019-08-16 ENCOUNTER — Telehealth (INDEPENDENT_AMBULATORY_CARE_PROVIDER_SITE_OTHER): Payer: No Typology Code available for payment source | Admitting: Family Medicine

## 2019-08-16 ENCOUNTER — Other Ambulatory Visit: Payer: Self-pay

## 2019-08-16 DIAGNOSIS — J309 Allergic rhinitis, unspecified: Secondary | ICD-10-CM | POA: Diagnosis not present

## 2019-08-16 DIAGNOSIS — J069 Acute upper respiratory infection, unspecified: Secondary | ICD-10-CM | POA: Diagnosis not present

## 2019-08-16 DIAGNOSIS — J4541 Moderate persistent asthma with (acute) exacerbation: Secondary | ICD-10-CM

## 2019-08-16 MED ORDER — PREDNISONE 20 MG PO TABS: ORAL_TABLET | ORAL | 0 refills | Status: DC

## 2019-08-16 NOTE — Progress Notes (Signed)
Virtual Visit via Video Note  I connected with pt on 08/16/19 at  4:00 PM EDT by a video enabled telemedicine application and verified that I am speaking with the correct person using two identifiers.  Location patient: home Location provider:work or home office Persons participating in the virtual visit: patient, provider  I discussed the limitations of evaluation and management by telemedicine and the availability of in person appointments. The patient expressed understanding and agreed to proceed.  Telemedicine visit is a necessity given the COVID-19 restrictions in place at the current time.  HPI: 39 y/o male being seen today for respiratory complaints. I last saw him 04/26/19. A/P as of last visit: "1)allergic rhinitis and mod pers asthma-->poor control. Change allergra to trial of xyzal. Change flonase to dymista trial. Increase advair to 250/50 1 bid dosing. Continue pre-exercise albuterol AND q4h prn use for wheezing.  2) Insomnia: doing great on clonidine every night long term.  3) Right groin strain: discussed alteration in exercise regimen, will mail home rehab/stretching exercises, recommended aleve 440mg  bid x 7d with food, apply heat when resting and apply ice after exercise."  HPI: 4 days or so ago went on a very long bike ride, next day woke up with R sided nasal congestion, feeling sluggish, building up mucous from R nostril.  Started taking mucinex sinus last couple days. More asthma sx's the last 48h, lots of coughing when trying to talk.  Chest feels tight. Minimal mucous production.  No wheezing or SOB.  No fever. +Fatigue.  Using albut q6h or so when awake, has been compliant with advair 250/50. Dymista 2 sprays hs helped.  Ran out for a while and used flonase, then restarted dymista a few days ago. Xyzal made him very hungry and was gaining wt. He switched to claritin and he feels like it is helping ok.  This is his first flare of asthma since I saw him 4 mo  ago and increased his advair. 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  ROS: no dizziness, no HAs, no rashes, no melena/hematochezia.  No polyuria or polydipsia.  No myalgias or arthralgias.  No focal weakness, paresthesias, or tremors.  No acute vision or hearing abnormalities. No n/v/d or abd pain.  No palpitations.     Past Medical History:  Diagnosis Date   Allergic rhinitis    ragweed   Anxiety    Failed trial of wellbutrin in the past   Collapsed lung 05/2012   Collapsed RML in the midst of hospitalization for asthma exacerbation with RML pneumonia (pt to f/u with Dr. 06/2012)   Cyst of paranasal sinus 02/2015   Detected by dental imaging; pt refered to surgeon by his dentist per his report 02/2015   Elevated blood pressure reading without diagnosis of hypertension    Herpes simplex    genital, recurrent   Insomnia 10/21/2010   Moderate persistent asthma    Oral allergy syndrome 2019   oral allergy syndrome (OAS) or pollen-food allergy syndrome (PFAS)-->allergist-->food allergy testing NEG.     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  Family History  Problem Relation Age of Onset   Cancer Mother        Thyroid: cured (age 22)   Cancer Paternal Grandfather        Prostate cancer in his 79s   Heart disease Maternal Grandfather  Mental illness Maternal Grandfather        bipolar?     Current Outpatient Medications:    acyclovir (ZOVIRAX) 400 MG tablet, Take 1 tablet (400 mg total) by mouth 2 (two) times daily., Disp: 60 tablet, Rfl: 11   albuterol (PROAIR HFA) 108 (90 Base) MCG/ACT inhaler, Inhale 2 puffs into the lungs every 6 (six) hours as needed for wheezing., Disp: 18 g, Rfl: 3   Azelastine-Fluticasone 137-50 MCG/ACT SUSP, Place 1 spray into both nostrils 2 (two) times daily., Disp: 23 g, Rfl: 11   cloNIDine (CATAPRES) 0.1 MG tablet, Take 2 tablets  (0.2 mg total) by mouth at bedtime., Disp: 180 tablet, Rfl: 3   Fluticasone-Salmeterol (ADVAIR DISKUS) 250-50 MCG/DOSE AEPB, Inhale 1 puff into the lungs 2 (two) times daily., Disp: 180 each, Rfl: 3   ibuprofen (ADVIL,MOTRIN) 200 MG tablet, Take 200-400 mg by mouth every 6 (six) hours as needed for pain, fever or headache., Disp: , Rfl:    levocetirizine (XYZAL) 5 MG tablet, Take 1 tablet (5 mg total) by mouth every evening. (Patient not taking: Reported on 08/16/2019), Disp: 90 tablet, Rfl: 3  EXAM:  VITALS per patient if applicable: There were no vitals taken for this visit.   GENERAL: alert, oriented, appears well and in no acute distress  HEENT: atraumatic, conjunttiva clear, no obvious abnormalities on inspection of external nose and ears  NECK: normal movements of the head and neck  LUNGS: on inspection no signs of respiratory distress, breathing rate appears normal, no obvious gross SOB, gasping or wheezing Occ dry cough  CV: no obvious cyanosis  MS: moves all visible extremities without noticeable abnormality  PSYCH/NEURO: pleasant and cooperative, no obvious depression or anxiety, speech and thought processing grossly intact  ASSESSMENT AND PLAN:  Discussed the following assessment and plan:  1) Allergic rhinitis, worsening gradually despite dymista and antihistamine daily. He may have viral uri superimposed on this now. Low suspicion of bacterial sinusitis. Stay on current meds and he will contact the allergist he saw in 2019, Dr. Margaretmary Bayley, to get re-eval for possible allergy immuntherapy (which had been recommended for him in the past).  2) Acute flare of moderate persistent asthma: Prednisone 40mg  qd x 5d. Overall control has been good since increase in advair to 250/50 4 months ago, ESP his EIB. Continue albuterol q6h for rescue med.  -we discussed possible serious and likely etiologies, options for evaluation and workup, limitations of telemedicine visit vs in  person visit, treatment, treatment risks and precautions. Pt prefers to treat via telemedicine empirically rather then risking or undertaking an in person visit at this moment. Patient agrees to seek prompt in person care if worsening, new symptoms arise, or if is not improving with treatment.   I discussed the assessment and treatment plan with the patient. The patient was provided an opportunity to ask questions and all were answered. The patient agreed with the plan and demonstrated an understanding of the instructions.   The patient was advised to call back or seek an in-person evaluation if the symptoms worsen or if the condition fails to improve as anticipated.  F/u: if not improving  Signed:  , MD           08/16/2019

## 2019-11-15 ENCOUNTER — Other Ambulatory Visit: Payer: Self-pay | Admitting: Family Medicine

## 2019-11-15 ENCOUNTER — Other Ambulatory Visit: Payer: Self-pay

## 2019-11-15 MED ORDER — FLUTICASONE-SALMETEROL 250-50 MCG/DOSE IN AEPB: 1.0000 | INHALATION_SPRAY | Freq: Two times a day (BID) | RESPIRATORY_TRACT | 0 refills | Status: DC

## 2019-11-15 NOTE — Telephone Encounter (Signed)
Spoke with patient and he already contacted the pharmacy. Refill was not on file. Rx submitted. CPE appt scheduled for 10/28

## 2019-11-15 NOTE — Telephone Encounter (Signed)
Patient refill request  Fluticasone-Salmeterol (ADVAIR DISKUS) 250-50 MCG/DOSE AEPB [[id number]]    [id number] - [place], [place] - [address]

## 2019-11-29 ENCOUNTER — Encounter: Payer: No Typology Code available for payment source | Admitting: Family Medicine

## 2019-11-29 NOTE — Progress Notes (Deleted)
Virtual Visit via Video Note  I connected with pt on 11/29/19 at  9:30 AM EDT by a video enabled telemedicine application and verified that I am speaking with the correct person using two identifiers.  Location patient: home Location provider:work or home office Persons participating in the virtual visit: patient, provider  I discussed the limitations of evaluation and management by telemedicine and the availability of in person appointments. The patient expressed understanding and agreed to proceed.  HPI: 39 y/o male being seen today for f/u moderate persistent asthma, allerg rhinitis, anxiety, and insomnia. I last saw him 08/16/19. A/P as of last visit: "1) Allergic rhinitis, worsening gradually despite dymista and antihistamine daily. He may have viral uri superimposed on this now. Low suspicion of bacterial sinusitis. Stay on current meds and he will contact the allergist he saw in 2019, Dr. Margaretmary Bayley, to get re-eval for possible allergy immuntherapy (which had been recommended for him in the past).  2) Acute flare of moderate persistent asthma: Prednisone 40mg  qd x 5d. Overall control has been good since increase in advair to 250/50 4 months ago, ESP his EIB. Continue albuterol q6h for rescue med."  INTERIM HX: ***   ROS: See pertinent positives and negatives per HPI.  Past Medical History:  Diagnosis Date  . Allergic rhinitis    ragweed  . Anxiety    Failed trial of wellbutrin in the past  . Collapsed lung 05/2012   Collapsed RML in the midst of hospitalization for asthma exacerbation with RML pneumonia (pt to f/u with Dr. 06/2012)  . 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 Elevated blood pressure reading without diagnosis of hypertension   . Herpes simplex    genital, recurrent  . Insomnia 10/21/2010  . Moderate persistent asthma   . Oral allergy syndrome 2019   oral allergy syndrome  (OAS) or pollen-food allergy syndrome (PFAS)-->allergist-->food allergy testing NEG.    . 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 TMJ arthralgia    mouthguard hs helpful    No past surgical history on file.   Current Outpatient Medications:  .  acyclovir (ZOVIRAX) 400 MG tablet, TAKE 1 TABLET BY MOUTH TWICE A DAY, Disp: 60 tablet, Rfl: 0 .  albuterol (PROAIR HFA) 108 (90 Base) MCG/ACT inhaler, Inhale 2 puffs into the lungs every 6 (six) hours as needed for wheezing., Disp: 18 g, Rfl: 3 .  Azelastine-Fluticasone 137-50 MCG/ACT SUSP, Place 1 spray into both nostrils 2 (two) times daily., Disp: 23 g, Rfl: 11 .  cloNIDine (CATAPRES) 0.1 MG tablet, Take 2 tablets (0.2 mg total) by mouth at bedtime., Disp: 180 tablet, Rfl: 3 .  Fluticasone-Salmeterol (ADVAIR DISKUS) 250-50 MCG/DOSE AEPB, Inhale 1 puff into the lungs 2 (two) times daily., Disp: 60 each, Rfl: 0 .  ibuprofen (ADVIL,MOTRIN) 200 MG tablet, Take 200-400 mg by mouth every 6 (six) hours as needed for pain, fever or headache., Disp: , Rfl:  .  levocetirizine (XYZAL) 5 MG tablet, Take 1 tablet (5 mg total) by mouth every evening. (Patient not taking: Reported on 08/16/2019), Disp: 90 tablet, Rfl: 3 .  predniSONE (DELTASONE) 20 MG tablet, 2 tabs po qd x 5d, Disp: 10 tablet, Rfl: 0  EXAM:  VITALS per patient if applicable:  Vitals with BMI 04/13/2018 01/30/2018 11/16/2017  Height 5' 7.5" 5' 7.5" 5' 7.5"  Weight 186 lbs 184 lbs  4 oz 189 lbs 10 oz  BMI 28.68 28.42 29.24  Systolic 131 115 469  Diastolic 85 73 82  Pulse 74 75 66     GENERAL: alert, oriented, appears well and in no acute distress  HEENT: atraumatic, conjunttiva clear, no obvious abnormalities on inspection of external nose and ears  NECK: normal movements of the head and neck  LUNGS: on inspection no signs of respiratory distress, breathing rate appears normal, no obvious gross SOB, gasping or wheezing  CV: no obvious cyanosis  MS: moves all visible  extremities without noticeable abnormality  PSYCH/NEURO: pleasant and cooperative, no obvious depression or anxiety, speech and thought processing grossly intact  LABS: none today  Lab Results  Component Value Date   TSH 2.97 10/06/2017   Lab Results  Component Value Date   WBC 6.4 10/06/2017   HGB 16.2 10/06/2017   HCT 47.2 10/06/2017   MCV 90.1 10/06/2017   PLT 242 10/06/2017   Lab Results  Component Value Date   CREATININE 1.16 10/06/2017   BUN 12 10/06/2017   NA 137 10/06/2017   K 4.6 10/06/2017   CL 101 10/06/2017   CO2 26 10/06/2017   Lab Results  Component Value Date   ALT 20 10/06/2017   AST 18 10/06/2017   ALKPHOS 44 11/04/2015   BILITOT 1.0 10/06/2017   Lab Results  Component Value Date   CHOL 223 (H) 10/06/2017   Lab Results  Component Value Date   HDL 42 10/06/2017   Lab Results  Component Value Date   LDLCALC 155 (H) 10/06/2017   Lab Results  Component Value Date   TRIG 140 10/06/2017   Lab Results  Component Value Date   CHOLHDL 5.3 (H) 10/06/2017    ASSESSMENT AND PLAN:  Discussed the following assessment and plan:  No diagnosis found.  -we discussed possible serious and likely etiologies, options for evaluation and workup, limitations of telemedicine visit vs in person visit, treatment, treatment risks and precautions. Pt prefers to treat via telemedicine empirically rather than in person at this moment.  Work/School slipped offered: provided in patient instructions  ***  declined Scheduled follow up with PCP offered: Declined  ***  Sent message to schedulers to assist and advised patient to contact PCP office to schedule if does not receive call back in next 24 hours. Advised to seek prompt in person care if worsening, new symptoms arise, or if is not improving with treatment. Discussed options for inperson care if PCP office not available. Did let this patient know that I only do telemedicine on Tuesdays and Thursdays for [place].  Advised to schedule follow up visit with PCP or [facility] if any further questions or concerns to avoid delays in care.   I discussed the assessment and treatment plan with the patient. The patient was provided an opportunity to ask questions and all were answered. The patient agreed with the plan and demonstrated an understanding of the instructions.    F/u: ***  Signed:  Santiago Bumpers, MD           11/29/2019

## 2019-12-08 ENCOUNTER — Other Ambulatory Visit: Payer: Self-pay | Admitting: Family Medicine

## 2019-12-11 ENCOUNTER — Encounter: Payer: Self-pay | Admitting: Family Medicine

## 2019-12-11 ENCOUNTER — Other Ambulatory Visit: Payer: Self-pay

## 2019-12-11 ENCOUNTER — Ambulatory Visit (INDEPENDENT_AMBULATORY_CARE_PROVIDER_SITE_OTHER): Payer: No Typology Code available for payment source | Admitting: Family Medicine

## 2019-12-11 VITALS — BP 104/72 | HR 74 | Temp 97.9°F | Resp 16 | Ht 67.0 in | Wt 173.8 lb

## 2019-12-11 DIAGNOSIS — M26621 Arthralgia of right temporomandibular joint: Secondary | ICD-10-CM | POA: Diagnosis not present

## 2019-12-11 DIAGNOSIS — E663 Overweight: Secondary | ICD-10-CM

## 2019-12-11 DIAGNOSIS — Z0000 Encounter for general adult medical examination without abnormal findings: Secondary | ICD-10-CM | POA: Diagnosis not present

## 2019-12-11 DIAGNOSIS — Z23 Encounter for immunization: Secondary | ICD-10-CM | POA: Diagnosis not present

## 2019-12-11 DIAGNOSIS — A6000 Herpesviral infection of urogenital system, unspecified: Secondary | ICD-10-CM

## 2019-12-11 DIAGNOSIS — J4540 Moderate persistent asthma, uncomplicated: Secondary | ICD-10-CM

## 2019-12-11 DIAGNOSIS — F5101 Primary insomnia: Secondary | ICD-10-CM

## 2019-12-11 MED ORDER — ACYCLOVIR 400 MG PO TABS
400.0000 mg | ORAL_TABLET | Freq: Two times a day (BID) | ORAL | 3 refills | Status: DC
Start: 2019-12-11 — End: 2021-11-30

## 2019-12-11 NOTE — Progress Notes (Signed)
Office Note 12/11/2019  CC:  Chief Complaint  Patient presents with  . Annual Exam    pt is fasting   HPI:  Matthew Petty is a 39 y.o. White male who is here for annual health maintenance exam and f/u asthma and insomnia.  Feeling fine.  Family is well--2 kids and wife. Still working with [employer].  Insomnia: sleep is consistently good as long as he takes clonidine.  Asthma: well controlled, compliant with meds.  Starting to get back into exercising last few weeks. Diet is fair.  R TMJ pain.    Past Medical History:  Diagnosis Date  . Allergic rhinitis    ragweed  . Anxiety    Failed trial of wellbutrin in the past  . Collapsed lung 05/2012   Collapsed RML in the midst of hospitalization for asthma exacerbation with RML pneumonia (pt to f/u with Dr. Marchelle Gearing)  . 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 Elevated blood pressure reading without diagnosis of hypertension   . Herpes simplex    genital, recurrent  . Insomnia 10/21/2010  . Moderate persistent asthma   . Oral allergy syndrome 2019   oral allergy syndrome (OAS) or pollen-food allergy syndrome (PFAS)-->allergist-->food allergy testing NEG.    . 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 TMJ arthralgia    mouthguard hs helpful    History reviewed. No pertinent surgical history.  Family History  Problem Relation Age of Onset  . Cancer Mother        Thyroid: cured (age 72)  . Cancer Paternal Grandfather        Prostate cancer in his 65s  . Heart disease Maternal Grandfather   . Mental illness Maternal Grandfather        bipolar?    Social History   Socioeconomic History  . Marital status: Married    Spouse name: Tresa Endo  . Number of children: Not on file  . Years of education: Not on file  . Highest education level: Not on file  Occupational History    Employer: [employer]  Tobacco Use  .  Smoking status: Never Smoker  . Smokeless tobacco: Never Used  Vaping Use  . Vaping Use: Never used  Substance and Sexual Activity  . Alcohol use: Yes    Comment: weekend  . Drug use: No  . Sexual activity: Not on file  Other Topics Concern  . Not on file  Social History Narrative   Married, has two children.   Moved to [place] from [place] around 2006.   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   Has postgrad degree in business/finance.   No exercise.   No tobacco or drugs.  Drinks some alcohol on weekends/socially.   Social Determinants of Health   Financial Resource Strain:   . Difficulty of Paying Living Expenses: Not on file  Food Insecurity:   . Worried About Programme researcher, broadcasting/film/video in the Last Year: Not on file  . Ran Out of Food in the Last Year: Not on file  Transportation Needs:   . Lack of Transportation (Medical): Not on file  . Lack of Transportation (Non-Medical): Not on file  Physical Activity:   . Days of Exercise per Week: Not on file  . Minutes of Exercise per Session: Not on file  Stress:   . Feeling of Stress : Not  on file  Social Connections:   . Frequency of Communication with Friends and Family: Not on file  . Frequency of Social Gatherings with Friends and Family: Not on file  . Attends Religious Services: Not on file  . Active Member of Clubs or Organizations: Not on file  . Attends Banker Meetings: Not on file  . Marital Status: Not on file  Intimate Partner Violence:   . Fear of Current or Ex-Partner: Not on file  . Emotionally Abused: Not on file  . Physically Abused: Not on file  . Sexually Abused: Not on file    Outpatient Medications Prior to Visit  Medication Sig Dispense Refill  . albuterol (PROAIR HFA) 108 (90 Base) MCG/ACT inhaler Inhale 2 puffs into the lungs every 6 (six) hours as needed for wheezing. 18 g 3  . Azelastine-Fluticasone 137-50 MCG/ACT SUSP Place 1 spray into both nostrils 2 (two) times  daily. 23 g 11  . cloNIDine (CATAPRES) 0.1 MG tablet Take 2 tablets (0.2 mg total) by mouth at bedtime. 180 tablet 3  . Fexofenadine HCl (ALLEGRA PO) Take by mouth as needed.    . Fluticasone-Salmeterol (ADVAIR DISKUS) 250-50 MCG/DOSE AEPB Inhale 1 puff into the lungs 2 (two) times daily. 60 each 0  . ibuprofen (ADVIL,MOTRIN) 200 MG tablet Take 200-400 mg by mouth every 6 (six) hours as needed for pain, fever or headache.    Marland Kitchen acyclovir (ZOVIRAX) 400 MG tablet TAKE 1 TABLET BY MOUTH TWICE A DAY 60 tablet 0  . levocetirizine (XYZAL) 5 MG tablet Take 1 tablet (5 mg total) by mouth every evening. (Patient not taking: Reported on 08/16/2019) 90 tablet 3  . predniSONE (DELTASONE) 20 MG tablet 2 tabs po qd x 5d (Patient not taking: Reported on 12/11/2019) 10 tablet 0   No facility-administered medications prior to visit.    No Known Allergies  ROS Review of Systems  Constitutional: Negative for appetite change, chills, fatigue and fever.  HENT: Negative for congestion, dental problem, ear pain and sore throat.   Eyes: Negative for discharge, redness and visual disturbance.  Respiratory: Negative for cough, chest tightness, shortness of breath and wheezing.   Cardiovascular: Negative for chest pain, palpitations and leg swelling.  Gastrointestinal: Negative for abdominal pain, blood in stool, diarrhea, nausea and vomiting.  Genitourinary: Negative for difficulty urinating, dysuria, flank pain, frequency, hematuria and urgency.  Musculoskeletal: Negative for arthralgias, back pain, joint swelling, myalgias and neck stiffness.  Skin: Negative for pallor and rash.  Neurological: Negative for dizziness, speech difficulty, weakness and headaches.  Hematological: Negative for adenopathy. Does not bruise/bleed easily.  Psychiatric/Behavioral: Negative for confusion and sleep disturbance. The patient is not nervous/anxious.     PE; Vitals with BMI 12/11/2019 04/13/2018 01/30/2018  Height 5\' 7"  5' 7.5"  5' 7.5"  Weight 173 lbs 13 oz 186 lbs 184 lbs 4 oz  BMI 27.21 28.68 28.42  Systolic 104 131  Diastolic 72 85 73  Pulse 74 74 75    Gen: Alert, well appearing.  Patient is oriented to person, place, time, and situation. AFFECT: pleasant, lucid thought and speech. ENT: Ears: EACs clear, normal epithelium.  TMs with good light reflex and landmarks bilaterally.  Eyes: no injection, icteris, swelling, or exudate.  EOMI, PERRLA. Nose: no drainage or turbinate edema/swelling.  No injection or focal lesion.  Mouth: lips without lesion/swelling.  Oral mucosa pink and moist.  Dentition intact and without obvious caries or gingival swelling.  Oropharynx without erythema, exudate, or  swelling.  Neck: supple/nontender.  No LAD, mass, or TM.  Carotid pulses 2+ bilaterally, without bruits. CV: RRR, no m/r/g.   LUNGS: CTA bilat, nonlabored resps, good aeration in all lung fields. ABD: soft, NT, ND, BS normal.  No hepatospenomegaly or mass.  No bruits. EXT: no clubbing, cyanosis, or edema.  Musculoskeletal: no joint swelling, erythema, warmth, or tenderness.  ROM of all joints intact. Skin - no sores or suspicious lesions or rashes or color changes   Pertinent labs:  Lab Results  Component Value Date   TSH 2.97 10/06/2017   Lab Results  Component Value Date   WBC 6.4 10/06/2017   HGB 16.2 10/06/2017   HCT 47.2 10/06/2017   MCV 90.1 10/06/2017   PLT 242 10/06/2017   Lab Results  Component Value Date   CREATININE 1.16 10/06/2017   BUN 12 10/06/2017   NA 137 10/06/2017   K 4.6 10/06/2017   CL 101 10/06/2017   CO2 26 10/06/2017   Lab Results  Component Value Date   ALT 20 10/06/2017   AST 18 10/06/2017   ALKPHOS 44 11/04/2015   BILITOT 1.0 10/06/2017   Lab Results  Component Value Date   CHOL 223 (H) 10/06/2017   Lab Results  Component Value Date   HDL 42 10/06/2017   Lab Results  Component Value Date   LDLCALC 155 (H) 10/06/2017   Lab Results  Component Value Date    TRIG 140 10/06/2017   Lab Results  Component Value Date   CHOLHDL 5.3 (H) 10/06/2017    ASSESSMENT AND PLAN:   1) Insomnia: doing well on clonidine, TWO 0.1mg  tab qhs.  2) Mod pers asthma: stable. Cont advair 250/50, 1 p bid. Cont alb HFA q4h prn.  3) TMJ arthralgia, R. Has benefited significantly from "home made" mouth/bite guard. However, he has noted that this has altered the gap in his front teeth adversely. He requests referral to specialist to help get custom mouth piece/bite guard. Referred pt to oral surgeon today.  4) Health maintenance exam: Reviewed age and gender appropriate health maintenance issues (prudent diet, regular exercise, health risks of tobacco and excessive alcohol, use of seatbelts, fire alarms in home, use of sunscreen).  Also reviewed age and gender appropriate health screening as well as vaccine recommendations. Vaccines:  Flu vaccine today.  Covid UTD. Labs: fasting HP labs.  5) Recurrent herpes genitalis---continue acyclovir suppression therapy, refilled this med today.  An After Visit Summary was printed and given to the patient.  FOLLOW UP:  Return in about 6 months (around 06/09/2020) for routine chronic illness f/u.  Signed:  Santiago Bumpers, MD           12/11/2019

## 2019-12-12 LAB — COMPREHENSIVE METABOLIC PANEL
AG Ratio: 2.2 (calc) (ref 1.0–2.5)
ALT: 12 U/L (ref 9–46)
AST: 14 U/L (ref 10–40)
Albumin: 4.8 g/dL (ref 3.6–5.1)
Alkaline phosphatase (APISO): 44 U/L (ref 36–130)
BUN: 11 mg/dL (ref 7–25)
CO2: 25 mmol/L (ref 20–32)
Calcium: 10.3 mg/dL (ref 8.6–10.3)
Chloride: 104 mmol/L (ref 98–110)
Creat: 1.07 mg/dL (ref 0.60–1.35)
Globulin: 2.2 g/dL (calc) (ref 1.9–3.7)
Glucose, Bld: 77 mg/dL (ref 65–99)
Potassium: 4.5 mmol/L (ref 3.5–5.3)
Sodium: 139 mmol/L (ref 135–146)
Total Bilirubin: 0.8 mg/dL (ref 0.2–1.2)
Total Protein: 7 g/dL (ref 6.1–8.1)

## 2019-12-12 LAB — CBC WITH DIFFERENTIAL/PLATELET
Absolute Monocytes: 354 cells/uL (ref 200–950)
Basophils Absolute: 58 cells/uL (ref 0–200)
Basophils Relative: 1 %
Eosinophils Absolute: 249 cells/uL (ref 15–500)
Eosinophils Relative: 4.3 %
HCT: 48.3 % (ref 38.5–50.0)
Hemoglobin: 16.6 g/dL (ref 13.2–17.1)
Lymphs Abs: 1746 cells/uL (ref 850–3900)
MCH: 31.9 pg (ref 27.0–33.0)
MCHC: 34.4 g/dL (ref 32.0–36.0)
MCV: 92.9 fL (ref 80.0–100.0)
MPV: 11.2 fL (ref 7.5–12.5)
Monocytes Relative: 6.1 %
Neutro Abs: 3393 cells/uL (ref 1500–7800)
Neutrophils Relative %: 58.5 %
Platelets: 249 10*3/uL (ref 140–400)
RBC: 5.2 10*6/uL (ref 4.20–5.80)
RDW: 12.2 % (ref 11.0–15.0)
Total Lymphocyte: 30.1 %
WBC: 5.8 10*3/uL (ref 3.8–10.8)

## 2019-12-12 LAB — LIPID PANEL
Cholesterol: 209 mg/dL — ABNORMAL HIGH (ref <200)
HDL: 59 mg/dL (ref >=40)
LDL Cholesterol (Calc): 130 mg/dL (calc) — ABNORMAL HIGH
Non-HDL Cholesterol (Calc): 150 mg/dL (calc) — ABNORMAL HIGH (ref <130)
Total CHOL/HDL Ratio: 3.5 (calc) (ref <5.0)
Triglycerides: 101 mg/dL (ref <150)

## 2019-12-12 LAB — TSH: TSH: 1.63 mIU/L (ref 0.40–4.50)

## 2020-04-16 ENCOUNTER — Other Ambulatory Visit: Payer: Self-pay | Admitting: Family Medicine

## 2020-06-13 ENCOUNTER — Encounter: Payer: Self-pay | Admitting: Family Medicine

## 2020-06-13 ENCOUNTER — Other Ambulatory Visit: Payer: Self-pay

## 2020-06-13 ENCOUNTER — Telehealth (INDEPENDENT_AMBULATORY_CARE_PROVIDER_SITE_OTHER): Payer: No Typology Code available for payment source | Admitting: Family Medicine

## 2020-06-13 VITALS — Ht 67.0 in | Wt 177.0 lb

## 2020-06-13 DIAGNOSIS — J069 Acute upper respiratory infection, unspecified: Secondary | ICD-10-CM

## 2020-06-13 DIAGNOSIS — Z20822 Contact with and (suspected) exposure to covid-19: Secondary | ICD-10-CM

## 2020-06-13 DIAGNOSIS — J4541 Moderate persistent asthma with (acute) exacerbation: Secondary | ICD-10-CM | POA: Diagnosis not present

## 2020-06-13 MED ORDER — NIRMATRELVIR/RITONAVIR (PAXLOVID)TABLET: ORAL_TABLET | ORAL | 0 refills | Status: DC

## 2020-06-13 MED ORDER — PREDNISONE 20 MG PO TABS: ORAL_TABLET | ORAL | 0 refills | Status: DC

## 2020-06-13 NOTE — Progress Notes (Signed)
Paxlovid Rx faxed to [email or web address] Received fax confirmation.

## 2020-06-13 NOTE — Progress Notes (Signed)
Virtual Visit via Video Note  I connected with pt on 06/13/20 at  4:00 PM EDT by a video enabled telemedicine application and verified that I am speaking with the correct person using two identifiers.  Location patient: home, [place] Location provider:work or home office Persons participating in the virtual visit: patient, provider  I discussed the limitations of evaluation and management by telemedicine and the availability of in person appointments. The patient expressed understanding and agreed to proceed.  Telemedicine visit is a necessity given the COVID-19 restrictions in place at the current time.  HPI: 40 y/o WM with moderate persistent asthma being seen today for cough and ST, +exposure to covid. Wife was around several people who have covid about a week ago. Pt started to have ST 3d/a, has also developed a cough, then yesterday more nasal and sinus sx's.  No fever.  Throat stopped hurting today.  Last 24h now having more dry cough, esp when he takes a deep breath.  No wheezing.  Mild feeling of SOB.  Has been taking albut q6h lately and it helps some. No otc cough med taken.  Took sudafed today.  Took 2 home covid tests this week and both neg, then did pcr test was done yesterday and no result yet.  ROS: See pertinent positives and negatives per HPI.  Past Medical History:  Diagnosis Date  . Allergic rhinitis    ragweed  . Anxiety    Failed trial of wellbutrin in the past  . Collapsed lung 05/2012   Collapsed RML in the midst of hospitalization for asthma exacerbation with RML pneumonia (pt to f/u with Dr. Marchelle Gearing)  . 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 Elevated blood pressure reading without diagnosis of hypertension   . Herpes simplex    genital, recurrent  . Insomnia 10/21/2010  . Moderate persistent asthma   . Oral allergy syndrome 2019   oral allergy syndrome (OAS) or pollen-food  allergy syndrome (PFAS)-->allergist-->food allergy testing NEG.    . 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 TMJ arthralgia    mouthguard hs helpful    No past surgical history on file.   Current Outpatient Medications:  .  acyclovir (ZOVIRAX) 400 MG tablet, Take 1 tablet (400 mg total) by mouth 2 (two) times daily., Disp: 180 tablet, Rfl: 3 .  albuterol (PROAIR HFA) 108 (90 Base) MCG/ACT inhaler, Inhale 2 puffs into the lungs every 6 (six) hours as needed for wheezing., Disp: 18 g, Rfl: 3 .  Azelastine-Fluticasone 137-50 MCG/ACT SUSP, Place 1 spray into both nostrils 2 (two) times daily., Disp: 23 g, Rfl: 11 .  cloNIDine (CATAPRES) 0.1 MG tablet, TAKE 2 TABLETS (0.2 MG TOTAL) BY MOUTH AT BEDTIME., Disp: 180 tablet, Rfl: 0 .  Fexofenadine HCl (ALLEGRA PO), Take by mouth as needed., Disp: , Rfl:  .  Fluticasone-Salmeterol (ADVAIR DISKUS) 250-50 MCG/DOSE AEPB, Inhale 1 puff into the lungs 2 (two) times daily., Disp: 60 each, Rfl: 0 .  ibuprofen (ADVIL,MOTRIN) 200 MG tablet, Take 200-400 mg by mouth every 6 (six) hours as needed for pain, fever or headache., Disp: , Rfl:  .  levocetirizine (XYZAL) 5 MG tablet, Take 1 tablet (5 mg total) by mouth every evening. (Patient not taking: No sig reported), Disp: 90 tablet, Rfl: 3  EXAM:  VITALS per patient if applicable:  Vitals with BMI 06/13/2020 12/11/2019  04/13/2018  Height 5\' 7"  5\' 7"  5' 7.5"  Weight 177 lbs 173 lbs 13 oz 186 lbs  BMI 27.72 27.21 28.68  Systolic - 104 131  Diastolic - 72 85  Pulse - 74 74    GENERAL: alert, oriented, appears well and in no acute distress  HEENT: atraumatic, conjunttiva clear, no obvious abnormalities on inspection of external nose and ears  NECK: normal movements of the head and neck  LUNGS: on inspection no signs of respiratory distress, breathing rate appears normal, no obvious gross SOB, gasping or wheezing  CV: no obvious cyanosis  MS: moves all visible extremities without  noticeable abnormality  PSYCH/NEURO: pleasant and cooperative, no obvious depression or anxiety, speech and thought processing grossly intact  LABS: none today    Chemistry      Component Value Date/Time   NA 139 12/11/2019 1119   K 4.5 12/11/2019 1119   CL 104 12/11/2019 1119   CO2 25 12/11/2019 1119   BUN 11 12/11/2019 1119   CREATININE 1.07 12/11/2019 1119      Component Value Date/Time   CALCIUM 10.3 12/11/2019 1119   ALKPHOS 44 11/04/2015 0908   AST 14 12/11/2019 1119   ALT 12 12/11/2019 1119   BILITOT 0.8 12/11/2019 1119      ASSESSMENT AND PLAN:  Discussed the following assessment and plan:  URI with cough, mild acute exacerbation of moderate persistent asthma. Exposure to covid, but 2 negative home (antigen) covid tests earlier in illness. Covid PCR test that he [facility] is pending. Given his risk of complications if he gets covid + he is w/in first 5d of illness he is a candidate for paxlovid.  I sent this med in and he is to start it only if his covid pcr result is positive. Also, prednisone 40mg  qd x 5d rx'd. Cont q6h prn albuterol and bid advair + allergy meds. He can add mucinex dm as well.  Signs/symptoms to call or return for were reviewed and pt expressed understanding.    I discussed the assessment and treatment plan with the patient. The patient was provided an opportunity to ask questions and all were answered. The patient agreed with the plan and demonstrated an understanding of the instructions.   F/u: if not improving signif in 4-5d  Signed:  13/10/2019, MD           06/13/2020

## 2020-07-15 ENCOUNTER — Other Ambulatory Visit: Payer: Self-pay | Admitting: Family Medicine

## 2020-08-13 ENCOUNTER — Other Ambulatory Visit: Payer: Self-pay | Admitting: Family Medicine

## 2020-08-14 ENCOUNTER — Other Ambulatory Visit: Payer: Self-pay

## 2020-08-14 ENCOUNTER — Telehealth: Payer: Self-pay

## 2020-08-14 MED ORDER — FLUTICASONE-SALMETEROL 250-50 MCG/ACT IN AEPB: INHALATION_SPRAY | RESPIRATORY_TRACT | 1 refills | Status: DC

## 2020-08-14 MED ORDER — ALBUTEROL SULFATE HFA 108 (90 BASE) MCG/ACT IN AERS: 2.0000 | INHALATION_SPRAY | Freq: Four times a day (QID) | RESPIRATORY_TRACT | 1 refills | Status: DC | PRN

## 2020-08-14 NOTE — Telephone Encounter (Signed)
Pt was made aware refills sent, encouraged to schedule follow up at his earliest convenience.

## 2020-08-14 NOTE — Telephone Encounter (Signed)
Patient refill request.  Patient states he is out of town for a month or longer.  albuterol [facility] HFA) 108 (90 Base) MCG/ACT inhaler [[id number]]   [facility] INHUB 250-50 MCG/ACT AEPB [[id number]]   [id number] - [place], [place] - [address]    Patient can be reached at [phone].

## 2020-08-27 ENCOUNTER — Other Ambulatory Visit: Payer: Self-pay | Admitting: Family Medicine

## 2020-10-02 ENCOUNTER — Other Ambulatory Visit: Payer: Self-pay | Admitting: Family Medicine

## 2020-10-13 ENCOUNTER — Telehealth: Payer: Self-pay

## 2020-10-13 ENCOUNTER — Other Ambulatory Visit: Payer: Self-pay

## 2020-10-13 MED ORDER — CLONIDINE HCL 0.1 MG PO TABS: 0.2000 mg | ORAL_TABLET | Freq: Every day | ORAL | 0 refills | Status: DC

## 2020-10-13 NOTE — Telephone Encounter (Signed)
Rx sent to pharmacy   

## 2020-10-13 NOTE — Telephone Encounter (Signed)
  Encourage patient to contact the pharmacy for refills or they can request refills through [facility]  LAST APPOINTMENT DATE:  06/13/20  NEXT APPOINTMENT DATE: 10/28/20  MEDICATION: cloNIDine (CATAPRES) 0.1 MG tablet  Is the patient out of medication? YES  PHARMACY: [id number] - [place], [place] - [address]  COMMENTS: Patient is requesting a refill to last until he comes in for an appointment  Let patient know to contact pharmacy at the end of the day to make sure medication is ready.  Please notify patient to allow 48-72 hours to process

## 2020-10-22 ENCOUNTER — Other Ambulatory Visit: Payer: Self-pay | Admitting: Family Medicine

## 2020-10-28 ENCOUNTER — Ambulatory Visit (INDEPENDENT_AMBULATORY_CARE_PROVIDER_SITE_OTHER): Payer: No Typology Code available for payment source | Admitting: Family Medicine

## 2020-10-28 ENCOUNTER — Other Ambulatory Visit: Payer: Self-pay

## 2020-10-28 ENCOUNTER — Encounter: Payer: Self-pay | Admitting: Family Medicine

## 2020-10-28 VITALS — BP 137/94 | HR 74 | Temp 98.2°F | Ht 67.0 in | Wt 182.2 lb

## 2020-10-28 DIAGNOSIS — J309 Allergic rhinitis, unspecified: Secondary | ICD-10-CM

## 2020-10-28 DIAGNOSIS — R030 Elevated blood-pressure reading, without diagnosis of hypertension: Secondary | ICD-10-CM

## 2020-10-28 DIAGNOSIS — J45909 Unspecified asthma, uncomplicated: Secondary | ICD-10-CM | POA: Diagnosis not present

## 2020-10-28 DIAGNOSIS — Z23 Encounter for immunization: Secondary | ICD-10-CM

## 2020-10-28 MED ORDER — ALBUTEROL SULFATE HFA 108 (90 BASE) MCG/ACT IN AERS: 2.0000 | INHALATION_SPRAY | Freq: Four times a day (QID) | RESPIRATORY_TRACT | 1 refills | Status: DC | PRN

## 2020-10-28 MED ORDER — FLUTICASONE-SALMETEROL 250-50 MCG/ACT IN AEPB: INHALATION_SPRAY | RESPIRATORY_TRACT | 3 refills | Status: DC

## 2020-10-28 MED ORDER — LEVOCETIRIZINE DIHYDROCHLORIDE 5 MG PO TABS: 5.0000 mg | ORAL_TABLET | Freq: Every evening | ORAL | 3 refills | Status: DC

## 2020-10-28 MED ORDER — CLONIDINE HCL 0.1 MG PO TABS: 0.2000 mg | ORAL_TABLET | Freq: Every day | ORAL | 3 refills | Status: DC

## 2020-10-28 MED ORDER — AZELASTINE-FLUTICASONE 137-50 MCG/ACT NA SUSP: 1.0000 | Freq: Two times a day (BID) | NASAL | 11 refills | Status: AC

## 2020-10-28 NOTE — Progress Notes (Signed)
OFFICE VISIT  10/28/2020  CC:  Chief Complaint  Patient presents with   Follow-up    RCI   HPI:    Patient is a 40 y.o. male who presents for f/u allergic rhinitis, asthma, and insomnia. Feeling well, just some feeling tired b/c not exercising and work/life more stressful lately.  No home bp checks. Hx of mild elev bp in the past but nothing persistent, we've never had to put him on medication.  No recent wheezing or worsening of chronic allergic rhinitis.  ROS as above, plus--> no fevers, no CP, no SOB, no wheezing, no cough, no dizziness, no HAs, no rashes, no melena/hematochezia.  No polyuria or polydipsia.  No myalgias or arthralgias.  No focal weakness, paresthesias, or tremors.  No acute vision or hearing abnormalities.  No dysuria or unusual/new urinary urgency or frequency.  No recent changes in lower legs. No n/v/d or abd pain.  No palpitations.     Past Medical History:  Diagnosis Date   Allergic rhinitis    ragweed   Anxiety    Failed trial of wellbutrin in the past   Collapsed lung 05/2012   Collapsed RML in the midst of hospitalization for asthma exacerbation with RML pneumonia (pt to f/u with Dr. Marchelle Gearing)   Cyst of paranasal sinus 02/2015   Detected by dental imaging; pt refered to surgeon by his dentist per his report 02/2015   Elevated blood pressure reading without diagnosis of hypertension    Herpes simplex    genital, recurrent   Insomnia 10/21/2010   Moderate persistent asthma    Oral allergy syndrome 2019   oral allergy syndrome (OAS) or pollen-food allergy syndrome (PFAS)-->allergist-->food allergy testing NEG.     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 No pertinent surgical history.  Outpatient Medications Prior to Visit  Medication Sig Dispense Refill   acyclovir (ZOVIRAX) 400 MG tablet Take 1 tablet (400 mg total) by mouth 2 (two) times daily. 180 tablet 3    Fexofenadine HCl (ALLEGRA PO) Take by mouth as needed.     ibuprofen (ADVIL,MOTRIN) 200 MG tablet Take 200-400 mg by mouth every 6 (six) hours as needed for pain, fever or headache.     albuterol (PROAIR HFA) 108 (90 Base) MCG/ACT inhaler Inhale 2 puffs into the lungs every 6 (six) hours as needed for wheezing. 18 g 1   Azelastine-Fluticasone 137-50 MCG/ACT SUSP Place 1 spray into both nostrils 2 (two) times daily. 23 g 11   cloNIDine (CATAPRES) 0.1 MG tablet Take 2 tablets (0.2 mg total) by mouth at bedtime. 30 tablet 0   fluticasone-salmeterol (WIXELA INHUB) 250-50 MCG/ACT AEPB TAKE 1 PUFF BY MOUTH TWICE A DAY 60 each 1   levocetirizine (XYZAL) 5 MG tablet Take 1 tablet (5 mg total) by mouth every evening. 90 tablet 3   nirmatrelvir/ritonavir EUA (PAXLOVID) TABS Patient GFR is 90. Take nirmatrelvir (150 mg) 2 tablet(s) twice daily for 5 days and ritonavir (100 mg) one tablet twice daily for 5 days. 30 tablet 0   predniSONE (DELTASONE) 20 MG tablet 2 tabs po qd x 5d 10 tablet 0   No facility-administered medications prior to visit.    No Known Allergies  ROS As per HPI  PE: Vitals with BMI 10/28/2020 06/13/2020 12/11/2019  Height 5\' 7"  5\' 7"  5\' 7"   Weight 182 lbs 3 oz 177 lbs 173 lbs 13  oz  BMI 28.53 27.72 27.21  Systolic 137 - 104  Diastolic 94 - 72  Pulse 74 - 74   Gen: Alert, well appearing.  Patient is oriented to person, place, time, and situation. AFFECT: pleasant, lucid thought and speech. CV: RRR, no m/r/g.   LUNGS: CTA bilat, nonlabored resps, good aeration in all lung fields. EXT: no clubbing or cyanosis.  no edema.    LABS:  Lab Results  Component Value Date   TSH 1.63 12/11/2019   Lab Results  Component Value Date   WBC 5.8 12/11/2019   HGB 16.6 12/11/2019   HCT 48.3 12/11/2019   MCV 92.9 12/11/2019   PLT 249 12/11/2019   Lab Results  Component Value Date   CREATININE 1.07 12/11/2019   BUN 11 12/11/2019   NA 139 12/11/2019   K 4.5 12/11/2019   CL 104  12/11/2019   CO2 25 12/11/2019   Lab Results  Component Value Date   ALT 12 12/11/2019   AST 14 12/11/2019   ALKPHOS 44 11/04/2015   BILITOT 0.8 12/11/2019   Lab Results  Component Value Date   CHOL 209 (H) 12/11/2019   Lab Results  Component Value Date   HDL 59 12/11/2019   Lab Results  Component Value Date   LDLCALC 130 (H) 12/11/2019   Lab Results  Component Value Date   TRIG 101 12/11/2019   Lab Results  Component Value Date   CHOLHDL 3.5 12/11/2019   IMPRESSION AND PLAN:  1) Asthma: doing well on advair 250/50 and albut q6h prn. RF's today.  2) Allergic rhinitis: doing well on dymista and nonsedating antihistamine daily.  3) Elevated bp w/out dx htn: discussed normal <130/80. We'll have him start checking bp at home---do this daily for a week and if avg >130/80 call or return. Tip sheet for checking bp at home reviewed and given to pt today.  An After Visit Summary was printed and given to the patient.  FOLLOW UP: Return in about 6 months (around 04/27/2021) for annual CPE (fasting).  Signed:  Santiago Bumpers, MD           10/28/2020

## 2020-11-14 ENCOUNTER — Other Ambulatory Visit: Payer: Self-pay

## 2020-11-14 ENCOUNTER — Ambulatory Visit (INDEPENDENT_AMBULATORY_CARE_PROVIDER_SITE_OTHER): Payer: No Typology Code available for payment source | Admitting: Allergy

## 2020-11-14 ENCOUNTER — Encounter: Payer: Self-pay | Admitting: Allergy

## 2020-11-14 VITALS — BP 126/78 | HR 80 | Temp 97.6°F | Resp 16 | Ht 67.0 in | Wt 182.5 lb

## 2020-11-14 DIAGNOSIS — H1013 Acute atopic conjunctivitis, bilateral: Secondary | ICD-10-CM | POA: Diagnosis not present

## 2020-11-14 DIAGNOSIS — J4530 Mild persistent asthma, uncomplicated: Secondary | ICD-10-CM | POA: Diagnosis not present

## 2020-11-14 DIAGNOSIS — J309 Allergic rhinitis, unspecified: Secondary | ICD-10-CM

## 2020-11-14 DIAGNOSIS — T781XXD Other adverse food reactions, not elsewhere classified, subsequent encounter: Secondary | ICD-10-CM

## 2020-11-14 MED ORDER — EPINEPHRINE 0.3 MG/0.3ML IJ SOAJ: 0.3000 mg | INTRAMUSCULAR | 1 refills | Status: DC | PRN

## 2020-11-14 MED ORDER — ALBUTEROL SULFATE HFA 108 (90 BASE) MCG/ACT IN AERS: 2.0000 | INHALATION_SPRAY | RESPIRATORY_TRACT | 1 refills | Status: DC | PRN

## 2020-11-14 MED ORDER — FLUTICASONE-SALMETEROL 100-50 MCG/ACT IN AEPB: 1.0000 | INHALATION_SPRAY | Freq: Two times a day (BID) | RESPIRATORY_TRACT | 5 refills | Status: DC

## 2020-11-14 NOTE — Patient Instructions (Addendum)
Allergies  - continue avoidance measures for grasses, weeds, trees, dust mites, cat  - trial Claritin 10mg  daily to see if this is more effective than Allegra at this time.  If effective you can rotate between Allegra and Claritin  - continue generic dymista 1 spray each nostril twice a day for nasal congestion and drainage.     - continue use of Alaway 1 drop each eye up to twice a day as needed or Pazeo 1 drop each eye daily as needed(sample provided)  - allergen immunotherapy discussed today including protocol, benefits and risk.  Informational handout provided.  If interested in this therapuetic option you can check with your insurance carrier for coverage. Schedule new start appointment.   Immunotherapy likely will help with OAS (see below)  Asthma  - have access to albuterol inhaler 2 puffs every 4-6 hours as needed for cough/wheeze/shortness of breath/chest tightness.  May use 15-20 minutes prior to activity.   Monitor frequency of use.   - continue use of Advair 100/50 1 puff twice a day   - lung function looks great today on spirometry   Asthma control goals:  Full participation in all desired activities (may need albuterol before activity) Albuterol use two time or less a week on average (not counting use with activity) Cough interfering with sleep two time or less a month Oral steroids no more than once a year No hospitalizations   Oral allergy syndrome  - The oral allergy syndrome (OAS) or pollen-food allergy syndrome (PFAS) is a relatively common form of food allergy, particularly in adults. It typically occurs in people who have pollen allergies when the immune system "sees" proteins on the food that look like proteins on the pollen. This results in the allergy antibody (IgE) binding to the food instead of the pollen. Patients typically report itching and/or mild swelling of the mouth and throat immediately following ingestion of certain uncooked fruits (including nuts) or raw  vegetables. Only a very small number of affected individuals experience systemic allergic reactions, such as anaphylaxis which occurs with true food allergies.   - skin testing to fruits are negative today   Follow-up 6 months or sooner if needed

## 2020-11-14 NOTE — Progress Notes (Signed)
Follow-up Note  RE: Matthew Petty MRN: [id number] DOB: 12/24/1980 Date of Office Visit: 11/14/2020   History of present illness: Matthew Petty is a 40 y.o. male presenting today for follow-up of allergic rhinitis with conjunctivitis, oral allergy syndrome and asthma.  He was last seen in the office on 11/16/2017 by myself.  He returns today as he is interested in proceeding with allergen immunotherapy.  He does report in the spring having a lot of itchy eyes. He states if he is outside for a significant time he does have itchy eyes, sneezing, cough/increase asthma symptoms.  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 He did try xyzal after last visit but states it made his drowsy and exhausted.  He is taking allegra currently.  He is using generic dymista 1 once a day.  He is doing Aloway as needed and it does work best for him.     He states several years ago he was exercising regularly.  However he recalls doing an extended bike ride and developed bronchitis.  He has not exercised much since this.  He would like to get back into exercising however this has been causing increase in asthma symptoms.  He does use the albuterol prior to activity.  He does continue to do Advair 1 puff twice a day.  He suspect he did have covid in [place] of last year and again around Feb 2022 as he states he and family did have fever.  He states he did test positive via PCR the family with the illness in February.  He states he and family had similar symptoms again in July.  He has had 3 doses of COVID-vaccine and states he is planning to get the 5 valent booster soon.  Review of systems: Review of Systems  Constitutional: Negative.   HENT:         See HPI  Eyes:        See HPI  Respiratory:         See HPI  Cardiovascular: Negative.   Gastrointestinal: Negative.   Musculoskeletal: Negative.   Neurological: Negative.    All other systems negative unless  noted above in HPI  Past medical/social/surgical/family history have been reviewed and are unchanged unless specifically indicated below.  No changes  Medication List: Current Outpatient Medications  Medication Sig Dispense Refill   acyclovir (ZOVIRAX) 400 MG tablet Take 1 tablet (400 mg total) by mouth 2 (two) times daily. 180 tablet 3   albuterol (PROAIR HFA) 108 (90 Base) MCG/ACT inhaler Inhale 2 puffs into the lungs every 6 (six) hours as needed for wheezing. 18 g 1   Azelastine-Fluticasone 137-50 MCG/ACT SUSP Place 1 spray into both nostrils 2 (two) times daily. 23 g 11   cloNIDine (CATAPRES) 0.1 MG tablet Take 2 tablets (0.2 mg total) by mouth at bedtime. 180 tablet 3   Fexofenadine HCl (ALLEGRA PO) Take by mouth as needed.     fluticasone-salmeterol (WIXELA INHUB) 250-50 MCG/ACT AEPB TAKE 1 PUFF BY MOUTH TWICE A DAY 180 each 3   ibuprofen (ADVIL,MOTRIN) 200 MG tablet Take 200-400 mg by mouth every 6 (six) hours as needed for pain, fever or headache.     levocetirizine (XYZAL) 5 MG tablet Take 1 tablet (5 mg total) by mouth every evening. 90 tablet 3   No current facility-administered medications for this visit.     Known medication allergies: No Known Allergies  Physical examination: Blood pressure 126/78, pulse 80, temperature 97.6 F (36.4 C), temperature source Temporal, resp. rate 16, height 5\' 7"  (1.702 m), weight 182 lb 8 oz (82.8 kg), SpO2 98 %.  General: Alert, interactive, in no acute distress. HEENT: PERRLA, TMs pearly gray, turbinates minimally edematous without discharge, post-pharynx non erythematous. Neck: Supple without lymphadenopathy. Lungs: Clear to auscultation without wheezing, rhonchi or rales. {no increased work of breathing. CV: Normal S1, S2 without murmurs. Abdomen: Nondistended, nontender. Skin: Warm and dry, without lesions or rashes. Extremities:  No clubbing, cyanosis or edema. Neuro:   Grossly intact.  Diagnositics/Labs:  Spirometry:  FEV1: 4.06L 105%, FVC: 4.82 L 100%, ratio consistent with nonobstructive pattern   Assessment and plan: Allergic rhinitis with conjunctivitis  - continue avoidance measures for grasses, weeds, trees, dust mites, cat  - trial Claritin 10mg  daily to see if this is more effective than Allegra at this time.  If effective you can rotate between Allegra and Claritin  - continue generic dymista 1 spray each nostril twice a day for nasal congestion and drainage.     - continue use of Alaway 1 drop each eye up to twice a day as needed or Pazeo 1 drop each eye daily as needed(sample provided)  - allergen immunotherapy discussed today including protocol, benefits and risk.  Informational handout provided.  If interested in this therapuetic option you can check with your insurance carrier for coverage. Schedule new start appointment.   Immunotherapy likely will help with OAS (see below)  Asthma  - have access to albuterol inhaler 2 puffs every 4-6 hours as needed for cough/wheeze/shortness of breath/chest tightness.  May use 15-20 minutes prior to activity.   Monitor frequency of use.   - continue use of Advair 100/50 1 puff twice a day   - lung function looks great today on spirometry   Asthma control goals:  Full participation in all desired activities (may need albuterol before activity) Albuterol use two time or less a week on average (not counting use with activity) Cough interfering with sleep two time or less a month Oral steroids no more than once a year No hospitalizations  Oral allergy syndrome  - The oral allergy syndrome (OAS) or pollen-food allergy syndrome (PFAS) is a relatively common form of food allergy, particularly in adults. It typically occurs in people who have pollen allergies when the immune system "sees" proteins on the food that look like proteins on the pollen. This results in the allergy antibody (IgE) binding to the food instead of the pollen. Patients typically report  itching and/or mild swelling of the mouth and throat immediately following ingestion of certain uncooked fruits (including nuts) or raw vegetables. Only a very small number of affected individuals experience systemic allergic reactions, such as anaphylaxis which occurs with true food allergies.   - skin testing to fruits are negative today   Follow-up 6 months or sooner if needed I appreciate the opportunity to take part in Bricyn's care. Please do not hesitate to contact me with questions.  Sincerely,   , MD Allergy/Immunology [facility]

## 2021-10-30 ENCOUNTER — Other Ambulatory Visit: Payer: Self-pay | Admitting: Family Medicine

## 2021-11-02 ENCOUNTER — Telehealth: Payer: Self-pay

## 2021-11-02 MED ORDER — CLONIDINE HCL 0.1 MG PO TABS: 0.2000 mg | ORAL_TABLET | Freq: Every day | ORAL | 0 refills | Status: DC

## 2021-11-02 NOTE — Telephone Encounter (Signed)
Patient refill request.  [facility] Patient is scheduled for CPE on 10/24 with Dr. Anitra Lauth.  cloNIDine (CATAPRES) 0.1 MG tablet [[id number]]

## 2021-11-02 NOTE — Telephone Encounter (Signed)
Pt advised refill sent until scheduled appt. °

## 2021-11-24 ENCOUNTER — Other Ambulatory Visit: Payer: Self-pay | Admitting: Family Medicine

## 2021-11-24 ENCOUNTER — Encounter: Payer: No Typology Code available for payment source | Admitting: Family Medicine

## 2021-11-30 ENCOUNTER — Ambulatory Visit (INDEPENDENT_AMBULATORY_CARE_PROVIDER_SITE_OTHER): Payer: No Typology Code available for payment source | Admitting: Family Medicine

## 2021-11-30 ENCOUNTER — Encounter: Payer: Self-pay | Admitting: Family Medicine

## 2021-11-30 VITALS — BP 136/89 | HR 82 | Temp 99.0°F | Ht 67.0 in | Wt 182.8 lb

## 2021-11-30 DIAGNOSIS — F5101 Primary insomnia: Secondary | ICD-10-CM

## 2021-11-30 DIAGNOSIS — Z0000 Encounter for general adult medical examination without abnormal findings: Secondary | ICD-10-CM | POA: Diagnosis not present

## 2021-11-30 DIAGNOSIS — Z23 Encounter for immunization: Secondary | ICD-10-CM | POA: Diagnosis not present

## 2021-11-30 MED ORDER — CLONIDINE HCL 0.1 MG PO TABS: 0.2000 mg | ORAL_TABLET | Freq: Every day | ORAL | 3 refills | Status: DC

## 2021-11-30 MED ORDER — ACYCLOVIR 400 MG PO TABS: 400.0000 mg | ORAL_TABLET | Freq: Two times a day (BID) | ORAL | 3 refills | Status: DC

## 2021-11-30 NOTE — Progress Notes (Signed)
OFFICE VISIT  11/30/2021  CC:  Chief Complaint  Patient presents with   Annual Exam    CPE Pt c/o having a tickle between shoulders of his back on the inside. Pt is fasting    Patient is a 41 y.o. male who presents for annual health maintenance exam and follow-up insomnia.  HPI: Other than being under quite a bit of stress with his job Matthew Petty seems to be doing well. States asthma well controlled.  Does have plans to follow-up with his allergy asthma doctor soon.  Only occasional outbreak of his genital herpes. Requests refill of acyclovir.  Clonidine still helpful for insomnia.  Past Medical History:  Diagnosis Date   Allergic rhinitis    ragweed   Anxiety    Failed trial of wellbutrin in the past   Collapsed lung 05/2012   Collapsed RML in the midst of hospitalization for asthma exacerbation with RML pneumonia (pt to f/u with Dr. Marchelle Gearing)   Cyst of paranasal sinus 02/2015   Detected by dental imaging; pt refered to surgeon by his dentist per his report 02/2015   Elevated blood pressure reading without diagnosis of hypertension    Herpes simplex    genital, recurrent   Insomnia 10/21/2010   Moderate persistent asthma    Oral allergy syndrome 2019   oral allergy syndrome (OAS) or pollen-food allergy syndrome (PFAS)-->allergist-->food allergy testing NEG.     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 No pertinent surgical history.  Outpatient Medications Prior to Visit  Medication Sig Dispense Refill   albuterol (PROAIR HFA) 108 (90 Base) MCG/ACT inhaler Inhale 2 puffs into the lungs every 6 (six) hours as needed for wheezing. 18 g 1   albuterol (VENTOLIN HFA) 108 (90 Base) MCG/ACT inhaler Inhale 2 puffs into the lungs every 4 (four) hours as needed for wheezing or shortness of breath. 18 g 1   Azelastine-Fluticasone 137-50 MCG/ACT SUSP Place 1 spray into both nostrils 2 (two) times daily. 23 g 11    EPINEPHrine 0.3 mg/0.3 mL IJ SOAJ injection Inject 0.3 mg into the muscle as needed for anaphylaxis. 1 each 1   fluticasone-salmeterol (ADVAIR DISKUS) 100-50 MCG/ACT AEPB Inhale 1 puff into the lungs 2 (two) times daily. 14 each 5   fluticasone-salmeterol (WIXELA INHUB) 250-50 MCG/ACT AEPB TAKE 1 PUFF BY MOUTH TWICE A DAY 180 each 3   ibuprofen (ADVIL,MOTRIN) 200 MG tablet Take 200-400 mg by mouth every 6 (six) hours as needed for pain, fever or headache.     levocetirizine (XYZAL) 5 MG tablet Take 1 tablet (5 mg total) by mouth every evening. 90 tablet 3   loratadine (CLARITIN) 10 MG tablet Take 10 mg by mouth daily.     acyclovir (ZOVIRAX) 400 MG tablet Take 1 tablet (400 mg total) by mouth 2 (two) times daily. 180 tablet 3   Fexofenadine HCl (ALLEGRA PO) Take by mouth as needed.     cloNIDine (CATAPRES) 0.1 MG tablet Take 2 tablets (0.2 mg total) by mouth at bedtime. 60 tablet 0   No facility-administered medications prior to visit.    No Known Allergies  ROS As per HPI  PE:    11/30/2021    9:50 AM 11/14/2020   11:13 AM 10/28/2020    9:44 AM  Vitals with BMI  Height 5\' 7"  5\' 7"  5\' 7"   Weight 182 lbs 13 oz 182 lbs  8 oz 182 lbs 3 oz  BMI 28.62 16.01 09.32  Systolic 355 732 202  Diastolic 89 78 94  Pulse 82 80 74     Physical Exam  Gen: Alert, well appearing.  Patient is oriented to person, place, time, and situation. AFFECT: pleasant, lucid thought and speech. ENT: Ears: EACs clear, normal epithelium.  TMs with good light reflex and landmarks bilaterally.  Eyes: no injection, icteris, swelling, or exudate.  EOMI, PERRLA. Nose: no drainage or turbinate edema/swelling.  No injection or focal lesion.  Mouth: lips without lesion/swelling.  Oral mucosa pink and moist.  Dentition intact and without obvious caries or gingival swelling.  Oropharynx without erythema, exudate, or swelling.  Neck: supple/nontender.  No LAD, mass, or TM.  Carotid pulses 2+ bilaterally, without  bruits. CV: RRR, no m/r/g.   LUNGS: CTA bilat, nonlabored resps, good aeration in all lung fields. ABD: soft, NT, ND, BS normal.  No hepatospenomegaly or mass.  No bruits. EXT: no clubbing, cyanosis, or edema.  Musculoskeletal: no joint swelling, erythema, warmth, or tenderness.  ROM of all joints intact. Skin - no sores or suspicious lesions or rashes or color changes   LABS:  Last CBC Lab Results  Component Value Date   WBC 5.8 12/11/2019   HGB 16.6 12/11/2019   HCT 48.3 12/11/2019   MCV 92.9 12/11/2019   MCH 31.9 12/11/2019   RDW 12.2 12/11/2019   PLT 249 54/27/0623   Last metabolic panel Lab Results  Component Value Date   GLUCOSE 77 12/11/2019   NA 139 12/11/2019   K 4.5 12/11/2019   CL 104 12/11/2019   CO2 25 12/11/2019   BUN 11 12/11/2019   CREATININE 1.07 12/11/2019   GFRNONAA >90 05/11/2012   CALCIUM 10.3 12/11/2019   PROT 7.0 12/11/2019   ALBUMIN 4.7 11/04/2015   BILITOT 0.8 12/11/2019   ALKPHOS 44 11/04/2015   AST 14 12/11/2019   ALT 12 12/11/2019   Last lipids Lab Results  Component Value Date   CHOL 209 (H) 12/11/2019   HDL 59 12/11/2019   LDLCALC 130 (H) 12/11/2019   TRIG 101 12/11/2019   CHOLHDL 3.5 12/11/2019   Last thyroid functions Lab Results  Component Value Date   TSH 1.63 12/11/2019   IMPRESSION AND PLAN:   1 health maintenance exam: Reviewed age and gender appropriate health maintenance issues (prudent diet, regular exercise, health risks of tobacco and excessive alcohol, use of seatbelts, fire alarms in home, use of sunscreen).  Also reviewed age and gender appropriate health screening as well as vaccine recommendations. Vaccines: prevnar 20-> given today.  Flu-> given today. Labs: Fasting health panel Prostate ca screening: average risk patient= as per latest guidelines, start screening at 42 yrs of age. Colon ca screening: average risk patient= as per latest guidelines, start screening at 76 yrs of age.  #2 insomnia, doing well  on clonidine 0.1 mg tabs, 2 nightly.  #3 recurrent herpes genitalis. Refilled acyclovir 400 mg twice daily.  #4 moderate persistent asthma, allergic rhinitis. Ongoing follow-up with allergist.  An After Visit Summary was printed and given to the patient.  FOLLOW UP: Return in about 1 year (around 12/01/2022) for annual CPE (fasting).  Signed:  Crissie Sickles, MD           11/30/2021

## 2021-11-30 NOTE — Patient Instructions (Signed)
Health Maintenance, Male Adopting a healthy lifestyle and getting preventive care are important in promoting health and wellness. Ask your health care provider about: The right schedule for you to have regular tests and exams. Things you can do on your own to prevent diseases and keep yourself healthy. What should I know about diet, weight, and exercise? Eat a healthy diet  Eat a diet that includes plenty of vegetables, fruits, low-fat dairy products, and lean protein. Do not eat a lot of foods that are high in solid fats, added sugars, or sodium. Maintain a healthy weight Body mass index (BMI) is a measurement that can be used to identify possible weight problems. It estimates body fat based on height and weight. Your health care provider can help determine your BMI and help you achieve or maintain a healthy weight. Get regular exercise Get regular exercise. This is one of the most important things you can do for your health. Most adults should: Exercise for at least 150 minutes each week. The exercise should increase your heart rate and make you sweat (moderate-intensity exercise). Do strengthening exercises at least twice a week. This is in addition to the moderate-intensity exercise. Spend less time sitting. Even light physical activity can be beneficial. Watch cholesterol and blood lipids Have your blood tested for lipids and cholesterol at 40 years of age, then have this test every 5 years. You may need to have your cholesterol levels checked more often if: Your lipid or cholesterol levels are high. You are older than 40 years of age. You are at high risk for heart disease. What should I know about cancer screening? Many types of cancers can be detected early and may often be prevented. Depending on your health history and family history, you may need to have cancer screening at various ages. This may include screening for: Colorectal cancer. Prostate cancer. Skin cancer. Lung  cancer. What should I know about heart disease, diabetes, and high blood pressure? Blood pressure and heart disease High blood pressure causes heart disease and increases the risk of stroke. This is more likely to develop in people who have high blood pressure readings or are overweight. Talk with your health care provider about your target blood pressure readings. Have your blood pressure checked: Every 3-5 years if you are 18-39 years of age. Every year if you are 40 years old or older. If you are between the ages of 65 and 75 and are a current or former smoker, ask your health care provider if you should have a one-time screening for abdominal aortic aneurysm (AAA). Diabetes Have regular diabetes screenings. This checks your fasting blood sugar level. Have the screening done: Once every three years after age 45 if you are at a normal weight and have a low risk for diabetes. More often and at a younger age if you are overweight or have a high risk for diabetes. What should I know about preventing infection? Hepatitis B If you have a higher risk for hepatitis B, you should be screened for this virus. Talk with your health care provider to find out if you are at risk for hepatitis B infection. Hepatitis C Blood testing is recommended for: Everyone born from 1945 through 1965. Anyone with known risk factors for hepatitis C. Sexually transmitted infections (STIs) You should be screened each year for STIs, including gonorrhea and chlamydia, if: You are sexually active and are younger than 40 years of age. You are older than 40 years of age and your   health care provider tells you that you are at risk for this type of infection. Your sexual activity has changed since you were last screened, and you are at increased risk for chlamydia or gonorrhea. Ask your health care provider if you are at risk. Ask your health care provider about whether you are at high risk for HIV. Your health care provider  may recommend a prescription medicine to help prevent HIV infection. If you choose to take medicine to prevent HIV, you should first get tested for HIV. You should then be tested every 3 months for as long as you are taking the medicine. Follow these instructions at home: Alcohol use Do not drink alcohol if your health care provider tells you not to drink. If you drink alcohol: Limit how much you have to 0-2 drinks a day. Know how much alcohol is in your drink. In the [place] one drink equals one 12 oz bottle of beer (355 mL), one 5 oz glass of wine (148 mL), or one 1 oz glass of hard liquor (44 mL). Lifestyle Do not use any products that contain nicotine or tobacco. These products include cigarettes, chewing tobacco, and vaping devices, such as e-cigarettes. If you need help quitting, ask your health care provider. Do not use street drugs. Do not share needles. Ask your health care provider for help if you need support or information about quitting drugs. General instructions Schedule regular health, dental, and eye exams. Stay current with your vaccines. Tell your health care provider if: You often feel depressed. You have ever been abused or do not feel safe at home. Summary Adopting a healthy lifestyle and getting preventive care are important in promoting health and wellness. Follow your health care provider's instructions about healthy diet, exercising, and getting tested or screened for diseases. Follow your health care provider's instructions on monitoring your cholesterol and blood pressure. This information is not intended to replace advice given to you by your health care provider. Make sure you discuss any questions you have with your health care provider. Document Revised: 06/09/2020 Document Reviewed: 06/09/2020 Elsevier Patient Education  2023 [employer] Inc[employer]  

## 2021-12-01 LAB — LIPID PANEL
Cholesterol: 235 mg/dL — ABNORMAL HIGH (ref <200)
HDL: 55 mg/dL (ref >=40)
LDL Cholesterol (Calc): 151 mg/dL (calc) — ABNORMAL HIGH
Non-HDL Cholesterol (Calc): 180 mg/dL (calc) — ABNORMAL HIGH (ref <130)
Total CHOL/HDL Ratio: 4.3 (calc) (ref <5.0)
Triglycerides: 158 mg/dL — ABNORMAL HIGH (ref <150)

## 2021-12-01 LAB — COMPREHENSIVE METABOLIC PANEL
AG Ratio: 1.9 (calc) (ref 1.0–2.5)
ALT: 15 U/L (ref 9–46)
AST: 16 U/L (ref 10–40)
Albumin: 4.7 g/dL (ref 3.6–5.1)
Alkaline phosphatase (APISO): 44 U/L (ref 36–130)
BUN: 11 mg/dL (ref 7–25)
CO2: 23 mmol/L (ref 20–32)
Calcium: 9.8 mg/dL (ref 8.6–10.3)
Chloride: 105 mmol/L (ref 98–110)
Creat: 1.14 mg/dL (ref 0.60–1.29)
Globulin: 2.5 g/dL (calc) (ref 1.9–3.7)
Glucose, Bld: 78 mg/dL (ref 65–99)
Potassium: 4.6 mmol/L (ref 3.5–5.3)
Sodium: 140 mmol/L (ref 135–146)
Total Bilirubin: 0.9 mg/dL (ref 0.2–1.2)
Total Protein: 7.2 g/dL (ref 6.1–8.1)

## 2021-12-01 LAB — CBC
HCT: 48 % (ref 38.5–50.0)
Hemoglobin: 16.5 g/dL (ref 13.2–17.1)
MCH: 31.7 pg (ref 27.0–33.0)
MCHC: 34.4 g/dL (ref 32.0–36.0)
MCV: 92.1 fL (ref 80.0–100.0)
MPV: 10.9 fL (ref 7.5–12.5)
Platelets: 248 10*3/uL (ref 140–400)
RBC: 5.21 10*6/uL (ref 4.20–5.80)
RDW: 12.8 % (ref 11.0–15.0)
WBC: 5.9 10*3/uL (ref 3.8–10.8)

## 2021-12-01 LAB — TSH: TSH: 1.7 mIU/L (ref 0.40–4.50)

## 2022-05-28 ENCOUNTER — Ambulatory Visit (INDEPENDENT_AMBULATORY_CARE_PROVIDER_SITE_OTHER): Payer: No Typology Code available for payment source | Admitting: Family Medicine

## 2022-05-28 ENCOUNTER — Encounter: Payer: Self-pay | Admitting: Family Medicine

## 2022-05-28 VITALS — BP 130/88 | HR 75 | Temp 98.3°F | Ht 67.0 in | Wt 180.6 lb

## 2022-05-28 DIAGNOSIS — J4541 Moderate persistent asthma with (acute) exacerbation: Secondary | ICD-10-CM | POA: Diagnosis not present

## 2022-05-28 MED ORDER — PREDNISONE 10 MG PO TABS: ORAL_TABLET | ORAL | 0 refills | Status: DC

## 2022-05-28 NOTE — Progress Notes (Signed)
OFFICE VISIT  05/28/2022  CC:  Chief Complaint  Patient presents with   Medical Management of Chronic Issues    Pt reports asthma flare up; flare started this week, he had a lot of pollen exposure for the past 2 weeks. He has been using maintenance and rescue inhalers.     Patient is a 42 y.o. male who presents for asthma flare.  HPI: For at least the last 7 to 10 days he has noted increased dry cough, some diminished ability to get in deep breaths. Nasal allergies worse lately.  Has been outside more lately in all the pollen. No fever.  No productive cough.  Using his albuterol every 6 hours lately. Increased sinus fullness and fatigue when he wakes up in the mornings lately.  No face pain.  No purulent nasal discharge.   Past Medical History:  Diagnosis Date   Allergic rhinitis    ragweed   Anxiety    Failed trial of wellbutrin in the past   Collapsed lung 05/2012   Collapsed RML in the midst of hospitalization for asthma exacerbation with RML pneumonia (pt to f/u with Dr. Marchelle Gearing)   Cyst of paranasal sinus 02/2015   Detected by dental imaging; pt refered to surgeon by his dentist per his report 02/2015   Elevated blood pressure reading without diagnosis of hypertension    Herpes simplex    genital, recurrent   Insomnia 10/21/2010   Moderate persistent asthma    Oral allergy syndrome 2019   oral allergy syndrome (OAS) or pollen-food allergy syndrome (PFAS)-->allergist-->food allergy testing NEG.     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 No pertinent surgical history.  Outpatient Medications Prior to Visit  Medication Sig Dispense Refill   acyclovir (ZOVIRAX) 400 MG tablet Take 1 tablet (400 mg total) by mouth 2 (two) times daily. 180 tablet 3   albuterol (VENTOLIN HFA) 108 (90 Base) MCG/ACT inhaler Inhale 2 puffs into the lungs every 4 (four) hours as needed for wheezing or shortness of breath.  18 g 1   Azelastine-Fluticasone 137-50 MCG/ACT SUSP Place 1 spray into both nostrils 2 (two) times daily. 23 g 11   cloNIDine (CATAPRES) 0.1 MG tablet Take 2 tablets (0.2 mg total) by mouth at bedtime. 180 tablet 3   fluticasone-salmeterol (WIXELA INHUB) 250-50 MCG/ACT AEPB TAKE 1 PUFF BY MOUTH TWICE A DAY 180 each 3   loratadine (CLARITIN) 10 MG tablet Take 10 mg by mouth daily.     EPINEPHrine 0.3 mg/0.3 mL IJ SOAJ injection Inject 0.3 mg into the muscle as needed for anaphylaxis. (Patient not taking: Reported on 05/28/2022) 1 each 1   ibuprofen (ADVIL,MOTRIN) 200 MG tablet Take 200-400 mg by mouth every 6 (six) hours as needed for pain, fever or headache. (Patient not taking: Reported on 05/28/2022)     albuterol (PROAIR HFA) 108 (90 Base) MCG/ACT inhaler Inhale 2 puffs into the lungs every 6 (six) hours as needed for wheezing. (Patient not taking: Reported on 05/28/2022) 18 g 1   fluticasone-salmeterol (ADVAIR DISKUS) 100-50 MCG/ACT AEPB Inhale 1 puff into the lungs 2 (two) times daily. (Patient not taking: Reported on 05/28/2022) 14 each 5   levocetirizine (XYZAL) 5 MG tablet Take 1 tablet (5 mg total) by mouth every evening. (Patient not taking: Reported on 05/28/2022) 90 tablet 3   No facility-administered medications prior to visit.  No Known Allergies  Review of Systems  As per HPI  PE:    05/28/2022   11:00 AM 11/30/2021    9:50 AM 11/14/2020   11:13 AM  Vitals with BMI  Height 5\' 7"  5\' 7"  5\' 7"   Weight 180 lbs 10 oz 182 lbs 13 oz 182 lbs 8 oz  BMI 28.28 28.62 28.58  Systolic 130 136 161  Diastolic 88 89 78  Pulse 75 82 80     Physical Exam  VS: noted--normal. Gen: alert, NAD, NONTOXIC APPEARING. HEENT: eyes without injection, drainage, or swelling.  Ears: EACs clear, TMs with normal light reflex and landmarks.  Nose: Clear rhinorrhea, with some dried, crusty exudate adherent to mildly injected mucosa.  No purulent d/c.  No paranasal sinus TTP.  No facial swelling.   Throat and mouth without focal lesion.  No pharyngial swelling, erythema, or exudate.   Neck: supple, no LAD.   LUNGS: CTA bilat, nonlabored resps.  He coughs easily after forceful exhalation---dry. CV: RRR, no m/r/g. EXT: no c/c/e SKIN: no rash   LABS:  Last CBC Lab Results  Component Value Date   WBC 5.9 11/30/2021   HGB 16.5 11/30/2021   HCT 48.0 11/30/2021   MCV 92.1 11/30/2021   MCH 31.7 11/30/2021   RDW 12.8 11/30/2021   PLT 248 11/30/2021   Last metabolic panel Lab Results  Component Value Date   GLUCOSE 78 11/30/2021   NA 140 11/30/2021   K 4.6 11/30/2021   CL 105 11/30/2021   CO2 23 11/30/2021   BUN 11 11/30/2021   CREATININE 1.14 11/30/2021   CALCIUM 9.8 11/30/2021   PROT 7.2 11/30/2021   ALBUMIN 4.7 11/04/2015   BILITOT 0.9 11/30/2021   ALKPHOS 44 11/04/2015   AST 16 11/30/2021   ALT 15 11/30/2021   Last lipids Lab Results  Component Value Date   CHOL 235 (H) 11/30/2021   HDL 55 11/30/2021   LDLCALC 151 (H) 11/30/2021   TRIG 158 (H) 11/30/2021   CHOLHDL 4.3 11/30/2021   Last thyroid functions Lab Results  Component Value Date   TSH 1.70 11/30/2021    IMPRESSION AND PLAN:  Allergic rhinitis and acute asthma flare. Prednisone taper: 40 x 2, 30 x 2, 20 x 2, and 10 x 2. Continue steroid nasal spray, antihistamine, Advair daily, and as needed albuterol.  An After Visit Summary was printed and given to the patient.  FOLLOW UP: Return if symptoms worsen or fail to improve.  Signed:  Santiago Bumpers, MD           05/28/2022

## 2022-06-07 ENCOUNTER — Telehealth: Payer: Self-pay

## 2022-06-07 NOTE — Telephone Encounter (Signed)
Per Practice Administrator;  Called patient - DOB verified - advised due to his symptoms of labored breathing, he needs to be seen sooner rather than later. Patient's last office visit was 11/14/20.  Patient was seen 05/28/22 by his PCP for Acute Exacerbation of Moderate Persistent Extrinsic Asthma - Rx Prednisone.  Rescheduled his appointment from Thursday to tomorrow, Tuesday, 06/08/22 @ 11:15 am with Dr. Allena Katz - [place] office. Patient is aware of our new address - [address] as well.  Patient verbalized understanding, no further questions.  Forwarding message to Dr. Allena Katz as update.

## 2022-06-08 ENCOUNTER — Other Ambulatory Visit: Payer: Self-pay

## 2022-06-08 ENCOUNTER — Ambulatory Visit (INDEPENDENT_AMBULATORY_CARE_PROVIDER_SITE_OTHER): Payer: No Typology Code available for payment source | Admitting: Internal Medicine

## 2022-06-08 ENCOUNTER — Encounter: Payer: Self-pay | Admitting: Internal Medicine

## 2022-06-08 VITALS — BP 132/90 | HR 89 | Temp 97.6°F | Resp 16 | Wt 178.8 lb

## 2022-06-08 DIAGNOSIS — J4530 Mild persistent asthma, uncomplicated: Secondary | ICD-10-CM | POA: Diagnosis not present

## 2022-06-08 DIAGNOSIS — J302 Other seasonal allergic rhinitis: Secondary | ICD-10-CM | POA: Diagnosis not present

## 2022-06-08 DIAGNOSIS — J3089 Other allergic rhinitis: Secondary | ICD-10-CM

## 2022-06-08 DIAGNOSIS — J383 Other diseases of vocal cords: Secondary | ICD-10-CM

## 2022-06-08 MED ORDER — FLUTICASONE PROPIONATE 50 MCG/ACT NA SUSP: 2.0000 | Freq: Every day | NASAL | 5 refills | Status: DC

## 2022-06-08 MED ORDER — BUDESONIDE-FORMOTEROL FUMARATE 160-4.5 MCG/ACT IN AERO: 2.0000 | INHALATION_SPRAY | Freq: Two times a day (BID) | RESPIRATORY_TRACT | 5 refills | Status: DC

## 2022-06-08 MED ORDER — LORATADINE 10 MG PO TABS: 10.0000 mg | ORAL_TABLET | Freq: Every day | ORAL | 5 refills | Status: DC

## 2022-06-08 MED ORDER — ALBUTEROL SULFATE HFA 108 (90 BASE) MCG/ACT IN AERS: 2.0000 | INHALATION_SPRAY | RESPIRATORY_TRACT | 1 refills | Status: DC | PRN

## 2022-06-08 MED ORDER — SPACER/AERO-HOLDING CHAMBERS DEVI: 1.0000 | Freq: Every day | 1 refills | Status: AC

## 2022-06-08 NOTE — Progress Notes (Signed)
FOLLOW UP Date of Service/Encounter:  06/08/22   Subjective:  Matthew Petty (DOB: 17-May-1980) is a 42 y.o. male who [facility] for follow up for an acute visit.   History obtained from: chart review and patient. Last visit was with Dr. Delorse Lek 11/14/2020 for allergic rhinoconjunctivitis and asthma.  Discussed starting AIT. Also on Advair 1 puff BID and was doing well.  Reports about 3-4 weeks ago, he has noticed worsening SOB, cough and chest tightness. No fevers, mostly dry cough.  It can happen without any clear triggers and many times when he is sitting there and not doing anything. He is under a lot of stress recently.  Has tried using Albuterol without much relief.  Has been on Wixela 250 1 puff BID for several years now. Saw PCP Dr. Milinda Cave recently and was treated for a possible asthma exacerbation with oral prednisone but doesn't think it helped much.   Reports allergies are doing okay, not having too much congestion/drainage/runny nose.  Taking Claritin and Flonase daily which do help. Is interested in AIT but planning to go on 2 month vacation soon.    Past Medical History: Past Medical History:  Diagnosis Date   Allergic rhinitis    ragweed   Anxiety    Failed trial of wellbutrin in the past   Collapsed lung 05/2012   Collapsed RML in the midst of hospitalization for asthma exacerbation with RML pneumonia (pt to f/u with Dr. Marchelle Gearing)   Cyst of paranasal sinus 02/2015   Detected by dental imaging; pt refered to surgeon by his dentist per his report 02/2015   Elevated blood pressure reading without diagnosis of hypertension    Herpes simplex    genital, recurrent   Insomnia 10/21/2010   Moderate persistent asthma    Oral allergy syndrome 2019   oral allergy syndrome (OAS) or pollen-food allergy syndrome (PFAS)-->allergist-->food allergy testing NEG.     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 Physical Exam: GEN: alert, well developed HEENT: clear conjunctiva, TM grey and translucent, nose with mild inferior turbinate hypertrophy, pink nasal mucosa, no rhinorrhea, no cobblestoning HEART: regular rate and rhythm, no murmur LUNGS: clear to auscultation bilaterally, no coughing, unlabored respiration SKIN: no rashes or lesions  Spirometry:  Tracings reviewed. His effort: Good reproducible efforts. FVC: 4.8L FEV1: 4.03L, 105% predicted FEV1/FVC ratio: 84% Interpretation: Spirometry consistent with normal pattern.  Please see scanned spirometry results for details.   Assessment:   1. Mild persistent asthma, uncomplicated   2. Seasonal and perennial allergic rhinitis   3. Vocal cord dysfunction     Plan/Recommendations:  Mild Persistent Asthma: - I am not sure if this is truly uncontrolled asthma or some component of VCD as he is having symptoms without any triggers like activity and unresponsive to oral prednisone. Will try to switch to Symbicort with spacer to see if that helps with improvement of technique.  Spirometry today was normal.  Discussed VCD exercises.  - Maintenance inhaler: start Symbicort 2 puffs twice daily with spacer. This replaces Wixela.   - Rescue inhaler:  Albuterol 2 puffs via spacer or 1 vial via nebulizer every 4-6 hours as needed for respiratory symptoms of cough, shortness of breath, or wheezing Asthma control goals:  Full participation in all desired activities (may need albuterol before activity) Albuterol use two times or less a week on average (not counting use with activity) Cough interfering with sleep two times or less a month Oral steroids no more than once a year No hospitalizations   Allergic Rhinitis: - SPT 11/2017 positive  to trees, grasses, weeds, dust mite, cat - Avoidance measures discussed. - Use nasal saline rinses before nose sprays such as with Neilmed Sinus Rinse.  Use distilled water.   - Use Flonase 2 sprays each nostril daily. Aim upward and outward. - Use Claritin 10 mg daily.  - Consider allergy shots as long term control of your symptoms by teaching your immune system to be more tolerant of your allergy triggers. If interested, hold anti histamines 3 days prior to next visit for possible retesting prior to starting AIT. Would not start now since you will be going on vacation.     Vocal Cord Dysfunction Breathing Exercises Use these breathing techniques at any sign of shortness of breath, wheezing, tightness or stridor/noisy breathing. If this occurs during activity, stop activity, do exercise until it stops and then resume activity gradually. Remember Tightness or stridor can be released by breathing exercises Do exercises easily - don't push shoulders or chest Concentrate on letting air in and out Go into new activities and sports gradually Diaphragmatic breathing exercises Do 10 cycles X3 Practice 2-3 times per day, lying down and sitting up & standing Concentrate on deep diaphragmatic breathing; relaxation of the entire upper body; and increased breath capacity Practice in a quiet environment to help encourage focus Have adult supervision until child can practice with ease on their own Once good breathing practice is established, practice more frequently throughout the day Review your "mental checklist" during breathing exercises Are my face, jaw, tongue relaxed? Is my throat open and relaxed? Are my shoulders relaxed and not moving? Is my chest relaxed and not moving? Is my diaphragm doing all the work: moving out for inhalation and in for exhalation? Is my breathing rate slow and rhythmic? Is my breath full and relaxed (can count to 2 seconds on inhalation and 4-5 seconds on  exhalation) Swallow-breathe technique Swallow followed by exhalation and initiation of diaphragmatic breathing. Do 10 full cycles of inhale/exhale. Continue with multiple cycles if needed, until the vocal cord dysfunction goes away. If a vocal cord dysfunction event refuses to be suppressed with this technique, analyze the problem more fully and consult medical assistance, if needed. Relaxed throat breath Do 5 of these relaxed throat breaths in the morning, at noon, before bedtime, before medications, as needed. Hand on abdomen (above the belt or both) when needed Inhale into abdomen- abdomen comes out Exhale from abdomen-abdomen comes in Inhale with relaxed throat: Tongue on floor of mouth Lips gently closed Jaw gently released Exhale       Return in about 2 months (around 08/08/2022).  Alesia Morin, MD [facility]

## 2022-06-08 NOTE — Patient Instructions (Addendum)
Mild Persistent Asthma: - Maintenance inhaler: start Symbicort 2 puffs twice daily with spacer. This replaces Wixela.   - Rescue inhaler: Albuterol 2 puffs via spacer or 1 vial via nebulizer every 4-6 hours as needed for respiratory symptoms of cough, shortness of breath, or wheezing Asthma control goals:  Full participation in all desired activities (may need albuterol before activity) Albuterol use two times or less a week on average (not counting use with activity) Cough interfering with sleep two times or less a month Oral steroids no more than once a year No hospitalizations   Allergic Rhinitis: - Avoidance measures discussed. - Use nasal saline rinses before nose sprays such as with Neilmed Sinus Rinse.  Use distilled water.   - Use Flonase 2 sprays each nostril daily. Aim upward and outward. - Use Claritin 10 mg daily.  - Consider allergy shots as long term control of your symptoms by teaching your immune system to be more tolerant of your allergy triggers. If interested, hold anti histamines 3 days prior to next visit for retesting.    Vocal Cord Dysfunction Breathing Exercises Use these breathing techniques at any sign of shortness of breath, wheezing, tightness or stridor/noisy breathing. If this occurs during activity, stop activity, do exercise until it stops and then resume activity gradually. Remember Tightness or stridor can be released by breathing exercises Do exercises easily - don't push shoulders or chest Concentrate on letting air in and out Go into new activities and sports gradually Diaphragmatic breathing exercises Do 10 cycles X3 Practice 2-3 times per day, lying down and sitting up & standing Concentrate on deep diaphragmatic breathing; relaxation of the entire upper body; and increased breath capacity Practice in a quiet environment to help encourage focus Have adult supervision until child can practice with ease on their own Once good breathing  practice is established, practice more frequently throughout the day Review your "mental checklist" during breathing exercises Are my face, jaw, tongue relaxed? Is my throat open and relaxed? Are my shoulders relaxed and not moving? Is my chest relaxed and not moving? Is my diaphragm doing all the work: moving out for inhalation and in for exhalation? Is my breathing rate slow and rhythmic? Is my breath full and relaxed (can count to 2 seconds on inhalation and 4-5 seconds on exhalation) Swallow-breathe technique Swallow followed by exhalation and initiation of diaphragmatic breathing. Do 10 full cycles of inhale/exhale. Continue with multiple cycles if needed, until the vocal cord dysfunction goes away. If a vocal cord dysfunction event refuses to be suppressed with this technique, analyze the problem more fully and consult medical assistance, if needed. Relaxed throat breath Do 5 of these relaxed throat breaths in the morning, at noon, before bedtime, before medications, as needed. Hand on abdomen (above the belt or both) when needed Inhale into abdomen- abdomen comes out Exhale from abdomen-abdomen comes in Inhale with relaxed throat: Tongue on floor of mouth Lips gently closed Jaw gently released Exhale

## 2022-06-10 ENCOUNTER — Ambulatory Visit: Payer: No Typology Code available for payment source | Admitting: Allergy

## 2022-09-28 ENCOUNTER — Ambulatory Visit (INDEPENDENT_AMBULATORY_CARE_PROVIDER_SITE_OTHER): Payer: No Typology Code available for payment source | Admitting: Internal Medicine

## 2022-09-28 ENCOUNTER — Other Ambulatory Visit: Payer: Self-pay

## 2022-09-28 ENCOUNTER — Encounter: Payer: Self-pay | Admitting: Internal Medicine

## 2022-09-28 VITALS — BP 138/40 | HR 75 | Temp 98.2°F | Resp 16 | Ht 67.0 in | Wt 181.1 lb

## 2022-09-28 DIAGNOSIS — J302 Other seasonal allergic rhinitis: Secondary | ICD-10-CM | POA: Diagnosis not present

## 2022-09-28 DIAGNOSIS — J4530 Mild persistent asthma, uncomplicated: Secondary | ICD-10-CM | POA: Diagnosis not present

## 2022-09-28 DIAGNOSIS — J3089 Other allergic rhinitis: Secondary | ICD-10-CM | POA: Diagnosis not present

## 2022-09-28 MED ORDER — FLUTICASONE-SALMETEROL 250-50 MCG/ACT IN AEPB: INHALATION_SPRAY | RESPIRATORY_TRACT | 3 refills | Status: AC

## 2022-09-28 MED ORDER — LEVOCETIRIZINE DIHYDROCHLORIDE 5 MG PO TABS
5.0000 mg | ORAL_TABLET | Freq: Every evening | ORAL | 5 refills | Status: DC
Start: 2022-09-28 — End: 2023-05-04

## 2022-09-28 MED ORDER — ALBUTEROL SULFATE HFA 108 (90 BASE) MCG/ACT IN AERS: 2.0000 | INHALATION_SPRAY | RESPIRATORY_TRACT | 1 refills | Status: AC | PRN

## 2022-09-28 MED ORDER — FLUTICASONE PROPIONATE 50 MCG/ACT NA SUSP: 2.0000 | Freq: Every day | NASAL | 5 refills | Status: DC

## 2022-09-28 MED ORDER — IPRATROPIUM BROMIDE 0.06 % NA SOLN: 2.0000 | Freq: Three times a day (TID) | NASAL | 5 refills | Status: DC | PRN

## 2022-09-28 NOTE — Patient Instructions (Addendum)
Mild Persistent Asthma: - With respiratory illness or flare ups, use Wixela 250-26mcg 2 puffs twice daily for 1-2 weeks.  - Rescue inhaler: Albuterol 2 puffs via spacer or 1 vial via nebulizer every 4-6 hours as needed for respiratory symptoms of cough, shortness of breath, or wheezing Asthma control goals:  Full participation in all desired activities (may need albuterol before activity) Albuterol use two times or less a week on average (not counting use with activity) Cough interfering with sleep two times or less a month Oral steroids no more than once a year No hospitalizations   Allergic Rhinitis: - Avoidance measures discussed. - Use nasal saline rinses before nose sprays such as with Neilmed Sinus Rinse.  Use distilled water.   - Use Flonase 2 sprays each nostril daily. Aim upward and outward. - Use Ipratropium 1-2 sprays each nostril up to three times a day as needed.  Aim upward and outward.   - Use Xyzal 5mg  daily.  Stop Claritin.  - Consider allergy shots as long term control of your symptoms by teaching your immune system to be more tolerant of your allergy triggers.  - Hold all anti histamines 3 days prior to next visit.    Overbook with me 8:30 AM on 9/10 for skin testing.

## 2022-09-28 NOTE — Progress Notes (Signed)
FOLLOW UP Date of Service/Encounter:  09/28/22   Subjective:  Matthew Petty (DOB: 19-Jan-1981) is a 42 y.o. male who [facility] for follow up for asthma and allergic rhinitis.   History obtained from: chart review and patient. Last visit was with me on 06/07/2022 and at the time was having worsening SOB/cough/chest tightness that would occur randomly without any clear triggers. Spirometry was normal.  Discussed possibly stress as a contributor.  Given VCD exercises and also switched to Symbicort from [facility] to see it helps.    Asthma Control Test: ACT Total Score: 22.    Since last visit, reports that he does think it was stress that was contributing to his symptoms.  He never got the Symbicort since his symptoms resolved with better control of stress.  Otherwise, asthma has done well overall.  There was one episode where he had dyspnea with change in weather while at the beach but did not require his inhaler.  He still has the Wixela and Albuterol PRN.  No ER visits or oral prednisone use since last visit.   His allergies have been flared up a lot this year. Worse in Spring/Summer. Having lots of congestion, itchy watery eyes.  Taking Flonase and Claritin.  Zyrtec causes sleepiness.  Azelastine did not help.  He does travel for work but is interested in AIT.   Past Medical History: Past Medical History:  Diagnosis Date   Allergic rhinitis    ragweed   Anxiety    Failed trial of wellbutrin in the past   Collapsed lung 05/2012   Collapsed RML in the midst of hospitalization for asthma exacerbation with RML pneumonia (pt to f/u with Dr. 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 Physical Exam: GEN: alert, well developed HEENT: clear conjunctiva, TM grey and translucent, nose with moderate inferior turbinate hypertrophy, boggy nasal mucosa, clear rhinorrhea, + cobblestoning HEART: regular rate and rhythm, no murmur LUNGS: clear to auscultation bilaterally, no coughing, unlabored respiration SKIN: no rashes or lesions   Spirometry:  Tracings reviewed. His effort: Good reproducible efforts. FVC: 4.66L FEV1: 3.66L, 96% predicted FEV1/FVC ratio: 79% Interpretation: Spirometry consistent with normal pattern.  Please see scanned spirometry results for details.   Assessment:   1. Mild persistent asthma, uncomplicated   2. Seasonal and perennial allergic rhinitis     Plan/Recommendations:  Mild Persistent Asthma: - Controlled with normal spirometry today.  Will do Wixela PRN. MDI teaching performed.  - With respiratory illness or flare ups, use Wixela 250-92mcg 2 puffs  twice daily for 1-2 weeks.  - Rescue inhaler: Albuterol 2 puffs via spacer or 1 vial via nebulizer every 4-6 hours as needed for respiratory symptoms of cough, shortness of breath, or wheezing Asthma control goals:  Full participation in all desired activities (may need albuterol before activity) Albuterol use two times or less a week on average (not counting use with activity) Cough interfering with sleep two times or less a month Oral steroids no more than once a year No hospitalizations   Allergic  Rhinitis: - Uncontrolled with recent worsening.  Due to turbinate hypertrophy, seasonal worsening, uncontrolled despite use of Flonase/Zyrtec and with worsening symptoms over the last year, will plan to repeat his allergy testing to identify new triggers.   - Avoidance measures discussed. - Use nasal saline rinses before nose sprays such as with Neilmed Sinus Rinse.  Use distilled water.   - Use Flonase 2 sprays each nostril daily. Aim upward and outward. - Use Ipratropium 1-2 sprays each nostril up to three times a day as needed.  Aim upward and outward.   - Use Xyzal 5mg  daily.  Stop Claritin.  - Consider allergy shots as long term control of your symptoms by teaching your immune system to be more tolerant of your allergy triggers. He is interested in AIT.  - Hold all anti histamines 3 days prior to next visit.   Return in about 2 weeks (around 10/12/2022).  Alesia Morin, MD [facility]

## 2022-10-12 ENCOUNTER — Ambulatory Visit (INDEPENDENT_AMBULATORY_CARE_PROVIDER_SITE_OTHER): Payer: No Typology Code available for payment source | Admitting: Internal Medicine

## 2022-10-12 ENCOUNTER — Encounter: Payer: Self-pay | Admitting: Internal Medicine

## 2022-10-12 VITALS — BP 114/82 | HR 68 | Temp 97.8°F

## 2022-10-12 DIAGNOSIS — J301 Allergic rhinitis due to pollen: Secondary | ICD-10-CM | POA: Diagnosis not present

## 2022-10-12 DIAGNOSIS — J3089 Other allergic rhinitis: Secondary | ICD-10-CM

## 2022-10-12 DIAGNOSIS — J3081 Allergic rhinitis due to animal (cat) (dog) hair and dander: Secondary | ICD-10-CM | POA: Diagnosis not present

## 2022-10-12 NOTE — Progress Notes (Signed)
FOLLOW UP Date of Service/Encounter:  10/12/22   Subjective:  Matthew Petty (DOB: [birth year]) is a 42 y.o. male who [facility] for follow up for skin testing.   History obtained from: chart review and patient.  Anti histamines held.   Past Medical History: Past Medical History:  Diagnosis Date   Allergic rhinitis    ragweed   Anxiety    Failed trial of wellbutrin in the past   Collapsed lung 05/2012   Collapsed RML in the midst of hospitalization for asthma exacerbation with RML pneumonia (pt to f/u with Dr. Marchelle Gearing)   Cyst of paranasal sinus 02/2015   Detected by dental imaging; pt refered to surgeon by his dentist per his report 02/2015   Elevated blood pressure reading without diagnosis of hypertension    Herpes simplex    genital, recurrent   Insomnia 10/21/2010   Moderate persistent asthma    Oral allergy syndrome 2019   oral allergy syndrome (OAS) or pollen-food allergy syndrome (PFAS)-->allergist-->food allergy testing NEG.     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 Physical Exam: GEN: alert, well developed HEENT: clear conjunctiva, MMM HEART: regular rate  LUNGS: no coughing, unlabored respiration SKIN: no rashes or lesions  Skin Testing:  Skin prick testing was placed, which includes aeroallergens/foods, histamine control, and saline control.  Verbal consent was obtained prior to placing test.  Patient tolerated procedure well.  Allergy testing results were read and interpreted by myself, documented by clinical staff. Adequate positive and negative control.  Positive results to:  Results discussed with patient/family.  Airborne Adult Perc - 10/12/22 0859     Time Antigen Placed 0859    Allergen Manufacturer  Waynette Buttery    Location Back    Number of Test 55    1. Control-Buffer 50% Glycerol Negative    2. Control-Histamine 3+    3. Bahia 3+    4. [place] 3+    5. Johnson 3+    6. [place] Blue 3+    7. Meadow Fescue 3+    8. Perennial Rye 3+    9. Timothy 3+    10. Ragweed Mix 3+    11. Cocklebur 3+    12. Plantain,  English 3+    13. Baccharis 3+    14. Dog Fennel 3+    15. Russian Thistle 3+    16. Lamb's Quarters 3+    17. Sheep Sorrell 3+    18. Rough Pigweed 3+    19. Marsh Elder, Rough 3+    20. Mugwort, Common 3+    21. Box, Elder 3+    22. Cedar, red Negative    23. Sweet Gum 3+    24. Pecan Pollen 3+    25. Pine Mix Negative    26. Walnut, Black Pollen 3+    27. Red Mulberry 3+    28. Ash Mix 3+    29. Birch Mix 3+    30. Beech American 3+    31. Cottonwood, Eastern 3+    32. Hickory, White 3+    33. Maple Mix 3+    34. [place], [place] Mix 3+  35. Sycamore Eastern 3+    36. Alternaria Alternata Negative    37. Cladosporium Herbarum Negative    38. Aspergillus Mix 3+    39. Penicillium Mix 3+    40. Bipolaris Sorokiniana (Helminthosporium) 3+    41. Drechslera Spicifera (Curvularia) 2+    42. Mucor Plumbeus Negative    43. Fusarium Moniliforme 2+    44. Aureobasidium Pullulans (pullulara) Negative    45. Rhizopus Oryzae Negative    46. Botrytis Cinera Negative    47. Epicoccum Nigrum Negative    48. Phoma Betae Negative    49. Dust Mite Mix 3+    50. Cat Hair 10,000 BAU/ml 3+    51.  Dog Epithelia Negative    52. Mixed Feathers Negative    53. Horse Epithelia Negative    54. Cockroach, German Negative    55. Tobacco Leaf Negative              Assessment:   1. Seasonal allergic rhinitis due to pollen   2. Allergic rhinitis caused by mold   3. Allergic rhinitis due to animal hair or dander   4. Allergic rhinitis due to dust mite     Plan/Recommendations:  Allergic Rhinitis: - Due to turbinate hypertrophy, worsening symptoms this season,  unresponsive to Flonase/Azelastine/Claritin, will plan to skin test this visit.  Also discussed AIT as he is on maximal medical therapy without improvement.  - SPT 10/2022: positive to trees, grasses, weeds, molds, dust mites, cats - Avoidance measures discussed. - Use nasal saline rinses before nose sprays such as with Neilmed Sinus Rinse.  Use distilled water.   - Use Flonase 2 sprays each nostril daily. Aim upward and outward. - Use Ipratropium 1-2 sprays each nostril up to three times a day as needed.  Aim upward and outward.   - Use Xyzal 5mg  daily.   - Consider allergy shots as long term control of your symptoms by teaching your immune system to be more tolerant of your allergy triggers. Given information to call insurance.  Please call back if interested in starting allergy shots and we will mix the vials and prescribe Epipen.   Mild Persistent Asthma: - With respiratory illness or flare ups, use Wixela 250-39mcg 2 puffs twice daily for 1-2 weeks.  - Rescue inhaler: Albuterol 2 puffs via spacer or 1 vial via nebulizer every 4-6 hours as needed for respiratory symptoms of cough, shortness of breath, or wheezing Asthma control goals:  Full participation in all desired activities (may need albuterol before activity) Albuterol use two times or less a week on average (not counting use with activity) Cough interfering with sleep two times or less a month Oral steroids no more than once a year No hospitalizations    Return in about 4 months (around 02/11/2023).  Alesia Morin, MD [facility]

## 2022-10-12 NOTE — Patient Instructions (Addendum)
Mild Persistent Asthma: - With respiratory illness or flare ups, use Wixela 250-72mcg 2 puffs twice daily for 1-2 weeks.  - Rescue inhaler: Albuterol 2 puffs via spacer or 1 vial via nebulizer every 4-6 hours as needed for respiratory symptoms of cough, shortness of breath, or wheezing Asthma control goals:  Full participation in all desired activities (may need albuterol before activity) Albuterol use two times or less a week on average (not counting use with activity) Cough interfering with sleep two times or less a month Oral steroids no more than once a year No hospitalizations   Allergic Rhinitis: - SPT 10/2022: positive to trees, grasses, weeds, molds, dust mites, cats - Avoidance measures discussed. - Use nasal saline rinses before nose sprays such as with Neilmed Sinus Rinse.  Use distilled water.   - Use Flonase 2 sprays each nostril daily. Aim upward and outward. - Use Ipratropium 1-2 sprays each nostril up to three times a day as needed.  Aim upward and outward.   - Use Xyzal 5mg  daily.   - Consider allergy shots as long term control of your symptoms by teaching your immune system to be more tolerant of your allergy triggers. Given information to call insurance.  Please call back if interested in starting allergy shots.  Must bring Epipen.

## 2022-11-26 ENCOUNTER — Other Ambulatory Visit: Payer: Self-pay | Admitting: Family Medicine

## 2022-12-06 ENCOUNTER — Other Ambulatory Visit: Payer: Self-pay

## 2022-12-06 ENCOUNTER — Telehealth: Payer: Self-pay | Admitting: Family Medicine

## 2022-12-06 MED ORDER — ACYCLOVIR 400 MG PO TABS: 400.0000 mg | ORAL_TABLET | Freq: Two times a day (BID) | ORAL | 0 refills | Status: DC

## 2022-12-06 MED ORDER — CLONIDINE HCL 0.1 MG PO TABS: 0.2000 mg | ORAL_TABLET | Freq: Every day | ORAL | 0 refills | Status: DC

## 2022-12-06 NOTE — Telephone Encounter (Signed)
[facility] is [facility] [address]

## 2022-12-06 NOTE — Telephone Encounter (Signed)
Mychart message sent to patient regarding refills, 30 day supply given.

## 2022-12-06 NOTE — Telephone Encounter (Signed)
Prescription Request  12/06/2022  LOV: 05/28/2022 Patient is scheduled for 11/14 but requesting temp fill until appointment   What is the name of the medication or equipment? cloNIDine (CATAPRES) 0.1 MG tablet , acyclovir (ZOVIRAX) 400 MG tablet   Have you contacted your pharmacy to request a refill? Yes   Which pharmacy would you like this sent to?  [id number] [place], [place] - [address] [place] [address] Phone: [phone] Fax: [phone]  [facility] [address] [place], [place] - [address] [place] [address] [place] [address] Phone: [phone] Fax: [phone]    Patient notified that their request is being sent to the clinical staff for review and that they should receive a response within 2 business days.   Please advise at [facility] [phone]

## 2022-12-15 NOTE — Progress Notes (Deleted)
Office Note 12/15/2022  CC: No chief complaint on file.   HPI:  Patient is a 42 y.o. male who is here for annual health maintenance exam and f/u insomnia.  Past Medical History:  Diagnosis Date   Allergic rhinitis    ragweed   Anxiety    Failed trial of wellbutrin in the past   Collapsed lung 05/2012   Collapsed RML in the midst of hospitalization for asthma exacerbation with RML pneumonia (pt to f/u with Dr. Marchelle Gearing)   Cyst of paranasal sinus 02/2015   Detected by dental imaging; pt refered to surgeon by his dentist per his report 02/2015   Elevated blood pressure reading without diagnosis of hypertension    Herpes simplex    genital, recurrent   Insomnia 10/21/2010   Moderate persistent asthma    Oral allergy syndrome 2019   oral allergy syndrome (OAS) or pollen-food allergy syndrome (PFAS)-->allergist-->food allergy testing NEG.     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  Family History  Problem Relation Age of Onset   Cancer Mother        Thyroid: cured (age 30)   Cancer Paternal Grandfather        Prostate cancer in his 33s   Heart disease Maternal Grandfather    Mental illness Maternal Grandfather        bipolar?    Social History   Socioeconomic History   Marital status: Married    Spouse name: Tresa Endo   Number of children: Not on file   Years of education: Not on file   Highest education level: Not on file  Occupational History    Employer: [employer]  Tobacco Use   Smoking status: Never    Passive exposure: Never   Smokeless tobacco: Never  Vaping Use   Vaping status: Never Used  Substance and Sexual Activity   Alcohol use: Yes    Comment: weekend   Drug use: Never   Sexual activity: Yes  Other Topics Concern   Not on file  Social History Narrative   Married, has two children.   Moved to [place] from [place] around 2006.    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   Has postgrad degree in business/finance.   No exercise.   No tobacco or drugs.  Drinks some alcohol on weekends/socially.   Social Determinants of Health   Financial Resource Strain: Not on file  Food Insecurity: Not on file  Transportation Needs: Not on file  Physical Activity: Not on file  Stress: Not on file  Social Connections: Not on file  Intimate Partner Violence: Not on file    Outpatient Medications Prior to Visit  Medication Sig Dispense Refill   acyclovir (ZOVIRAX) 400 MG tablet Take 1 tablet (400 mg total) by mouth 2 (two) times daily. MUST KEEP APPT FOR FURTHER REFILLS 60 tablet 0   albuterol (VENTOLIN HFA) 108 (90 Base) MCG/ACT inhaler Inhale 2 puffs into the lungs every 4 (four) hours as needed for wheezing or shortness of breath. 18 g 1   Azelastine-Fluticasone 137-50 MCG/ACT SUSP Place 1 spray into both nostrils 2 (two) times daily. 23 g 11   cloNIDine (CATAPRES) 0.1 MG tablet Take 2 tablets (0.2 mg total) by mouth at bedtime. MUST KEEP APPT FOR FURTHER REFILLS 60 tablet 0   fluticasone (FLONASE) 50 MCG/ACT nasal spray Place 2  sprays into both nostrils daily. 16 g 5   fluticasone-salmeterol (WIXELA INHUB) 250-50 MCG/ACT AEPB With respiratory illness or flare ups, use Wixela 250-26mcg 2 puffs twice daily for 1-2 weeks. 60 each 3   ibuprofen (ADVIL,MOTRIN) 200 MG tablet Take 200-400 mg by mouth every 6 (six) hours as needed for pain, fever or headache.     ipratropium (ATROVENT) 0.06 % nasal spray Place 2 sprays into both nostrils 3 (three) times daily as needed for rhinitis. 15 mL 5   levocetirizine (XYZAL) 5 MG tablet Take 1 tablet (5 mg total) by mouth every evening. 30 tablet 5   loratadine (CLARITIN) 10 MG tablet Take 1 tablet (10 mg total) by mouth daily. 30 tablet 5   Spacer/Aero-Holding Chambers DEVI 1 Device by Does not apply route daily. 1 each 1   No facility-administered medications prior to visit.    No  Known Allergies  Review of Systems *** PE;    10/12/2022    8:58 AM 09/28/2022   10:20 AM 06/08/2022   11:16 AM  Vitals with BMI  Height  5\' 7"    Weight  181 lbs 2 oz 178 lbs 13 oz  BMI  28.36 28  Systolic 114 138 161  Diastolic 82 40 90  Pulse 68 75 89     *** Pertinent labs:  Lab Results  Component Value Date   TSH 1.70 11/30/2021   Lab Results  Component Value Date   WBC 5.9 11/30/2021   HGB 16.5 11/30/2021   HCT 48.0 11/30/2021   MCV 92.1 11/30/2021   PLT 248 11/30/2021   Lab Results  Component Value Date   CREATININE 1.14 11/30/2021   BUN 11 11/30/2021   NA 140 11/30/2021   K 4.6 11/30/2021   CL 105 11/30/2021   CO2 23 11/30/2021   Lab Results  Component Value Date   ALT 15 11/30/2021   AST 16 11/30/2021   ALKPHOS 44 11/04/2015   BILITOT 0.9 11/30/2021   Lab Results  Component Value Date   CHOL 235 (H) 11/30/2021   Lab Results  Component Value Date   HDL 55 11/30/2021   Lab Results  Component Value Date   LDLCALC 151 (H) 11/30/2021   Lab Results  Component Value Date   TRIG 158 (H) 11/30/2021   Lab Results  Component Value Date   CHOLHDL 4.3 11/30/2021    ASSESSMENT AND PLAN:   No problem-specific Assessment & Plan notes found for this encounter.   health maintenance exam: Reviewed age and gender appropriate health maintenance issues (prudent diet, regular exercise, health risks of tobacco and excessive alcohol, use of seatbelts, fire alarms in home, use of sunscreen).  Also reviewed age and gender appropriate health screening as well as vaccine recommendations. Vaccines: Flu-->***. Labs: Fasting health panel Prostate ca screening: average risk patient= as per latest guidelines, start screening at 44 yrs of age. Colon ca screening: average risk patient= as per latest guidelines, start screening at 56 yrs of age.   #2 insomnia, doing well on clonidine 0.1 mg tabs, 2 nightly.  An After Visit Summary was printed and given to the  patient.  FOLLOW UP:  No follow-ups on file.  Signed:  Santiago Bumpers, MD           12/15/2022

## 2022-12-16 ENCOUNTER — Ambulatory Visit: Payer: No Typology Code available for payment source | Admitting: Family Medicine

## 2022-12-16 DIAGNOSIS — Z0000 Encounter for general adult medical examination without abnormal findings: Secondary | ICD-10-CM

## 2022-12-16 DIAGNOSIS — F5101 Primary insomnia: Secondary | ICD-10-CM

## 2022-12-19 NOTE — Progress Notes (Unsigned)
Office Note 12/20/2022  CC:  Chief Complaint  Patient presents with   Annual Exam    Pt is fasting.    HPI:  Patient is a 42 y.o. male who is here for annual health maintenance exam and f/u insomnia.  Home blood pressures usually around 130/80 but sometimes up into the 150s over 90s. He has been under a lot of stress with work lately. He has not been exercising much at all.  His diet is pretty good but does eat some food with high sodium.   No headaches, vision abnormalities, dizziness, fatigue, or chest pain.  Insomnia well-controlled on clonidine.  He is having some trouble maintaining erections lately.  Does not happen every time.  Going on for 3 to 4 months, under a lot more stress at work.  He feels a little bit of performance anxiety now.  Asthma well-controlled, followed by allergy/immunology.  Past Medical History:  Diagnosis Date   Allergic rhinitis    ragweed   Anxiety    Failed trial of wellbutrin in the past   Collapsed lung 05/2012   Collapsed RML in the midst of hospitalization for asthma exacerbation with RML pneumonia (pt to f/u with Dr. Marchelle Gearing)   Cyst of paranasal sinus 02/2015   Detected by dental imaging; pt refered to surgeon by his dentist per his report 02/2015   Elevated blood pressure reading without diagnosis of hypertension    Herpes simplex    genital, recurrent   Insomnia 10/21/2010   Moderate persistent asthma    Oral allergy syndrome 2019   oral allergy syndrome (OAS) or pollen-food allergy syndrome (PFAS)-->allergist-->food allergy testing NEG.     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 No pertinent surgical history.  Family History  Problem Relation Age of Onset   Cancer Mother        Thyroid: cured (age 56)   Cancer Paternal Grandfather        Prostate cancer in his 21s   Heart disease Maternal Grandfather    Mental illness Maternal Grandfather         bipolar?    Social History   Socioeconomic History   Marital status: Married    Spouse name: Tresa Endo   Number of children: Not on file   Years of education: Not on file   Highest education level: Not on file  Occupational History    Employer: [employer]  Tobacco Use   Smoking status: Never    Passive exposure: Never   Smokeless tobacco: Never  Vaping Use   Vaping status: Never Used  Substance and Sexual Activity   Alcohol use: Yes    Comment: weekend   Drug use: Never   Sexual activity: Yes  Other Topics Concern   Not on file  Social History Narrative   Married, has two children.   Moved to [place] from [place] around 2006.   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   Has postgrad degree in business/finance.   No exercise.   No tobacco or drugs.  Drinks some alcohol on weekends/socially.   Social Determinants of Health   Financial Resource Strain: Not on file  Food Insecurity: Not on file  Transportation Needs: Not on file  Physical Activity: Not on file  Stress: Not on file  Social Connections: Not on file  Intimate Partner Violence: Not on file    Outpatient  Medications Prior to Visit  Medication Sig Dispense Refill   albuterol (VENTOLIN HFA) 108 (90 Base) MCG/ACT inhaler Inhale 2 puffs into the lungs every 4 (four) hours as needed for wheezing or shortness of breath. 18 g 1   Azelastine-Fluticasone 137-50 MCG/ACT SUSP Place 1 spray into both nostrils 2 (two) times daily. 23 g 11   fluticasone (FLONASE) 50 MCG/ACT nasal spray Place 2 sprays into both nostrils daily. 16 g 5   fluticasone-salmeterol (WIXELA INHUB) 250-50 MCG/ACT AEPB With respiratory illness or flare ups, use Wixela 250-61mcg 2 puffs twice daily for 1-2 weeks. 60 each 3   ibuprofen (ADVIL,MOTRIN) 200 MG tablet Take 200-400 mg by mouth every 6 (six) hours as needed for pain, fever or headache.     ipratropium (ATROVENT) 0.06 % nasal spray Place 2 sprays into both nostrils 3  (three) times daily as needed for rhinitis. 15 mL 5   loratadine (CLARITIN) 10 MG tablet Take 1 tablet (10 mg total) by mouth daily. 30 tablet 5   Spacer/Aero-Holding Chambers DEVI 1 Device by Does not apply route daily. 1 each 1   acyclovir (ZOVIRAX) 400 MG tablet Take 1 tablet (400 mg total) by mouth 2 (two) times daily. MUST KEEP APPT FOR FURTHER REFILLS 60 tablet 0   cloNIDine (CATAPRES) 0.1 MG tablet Take 2 tablets (0.2 mg total) by mouth at bedtime. MUST KEEP APPT FOR FURTHER REFILLS 60 tablet 0   levocetirizine (XYZAL) 5 MG tablet Take 1 tablet (5 mg total) by mouth every evening. (Patient not taking: Reported on 12/20/2022) 30 tablet 5   No facility-administered medications prior to visit.    No Known Allergies  Review of Systems  Constitutional:  Negative for appetite change, chills, fatigue and fever.  HENT:  Negative for congestion, dental problem, ear pain and sore throat.   Eyes:  Negative for discharge, redness and visual disturbance.  Respiratory:  Negative for cough, chest tightness, shortness of breath and wheezing.   Cardiovascular:  Negative for chest pain, palpitations and leg swelling.  Gastrointestinal:  Negative for abdominal pain, blood in stool, diarrhea, nausea and vomiting.  Genitourinary:  Negative for difficulty urinating, dysuria, flank pain, frequency, hematuria and urgency.  Musculoskeletal:  Negative for arthralgias, back pain, joint swelling, myalgias and neck stiffness.  Skin:  Negative for pallor and rash.  Neurological:  Negative for dizziness, speech difficulty, weakness and headaches.  Hematological:  Negative for adenopathy. Does not bruise/bleed easily.  Psychiatric/Behavioral:  Negative for confusion and sleep disturbance. The patient is not nervous/anxious.     PE;    12/20/2022   10:45 AM 12/20/2022   10:35 AM 10/12/2022    8:58 AM  Vitals with BMI  Weight  184 lbs 3 oz   Systolic 134 135 161  Diastolic 86 93 82  Pulse  81 68    Gen:  Alert, well appearing.  Patient is oriented to person, place, time, and situation. AFFECT: pleasant, lucid thought and speech. ENT: Ears: EACs clear, normal epithelium.  TMs with good light reflex and landmarks bilaterally.  Eyes: no injection, icteris, swelling, or exudate.  EOMI, PERRLA. Nose: no drainage or turbinate edema/swelling.  No injection or focal lesion.  Mouth: lips without lesion/swelling.  Oral mucosa pink and moist.  Dentition intact and without obvious caries or gingival swelling.  Oropharynx without erythema, exudate, or swelling.  Neck: supple/nontender.  No LAD, mass, or TM.  Carotid pulses 2+ bilaterally, without bruits. CV: RRR, no m/r/g.   LUNGS: CTA bilat, nonlabored  resps, good aeration in all lung fields. ABD: soft, NT, ND, BS normal.  No hepatospenomegaly or mass.  No bruits. EXT: no clubbing, cyanosis, or edema.  Musculoskeletal: no joint swelling, erythema, warmth, or tenderness.  ROM of all joints intact. Skin - no sores or suspicious lesions or rashes or color changes   Pertinent labs:  Lab Results  Component Value Date   TSH 1.70 11/30/2021   Lab Results  Component Value Date   WBC 5.9 11/30/2021   HGB 16.5 11/30/2021   HCT 48.0 11/30/2021   MCV 92.1 11/30/2021   PLT 248 11/30/2021   Lab Results  Component Value Date   CREATININE 1.14 11/30/2021   BUN 11 11/30/2021   NA 140 11/30/2021   K 4.6 11/30/2021   CL 105 11/30/2021   CO2 23 11/30/2021   Lab Results  Component Value Date   ALT 15 11/30/2021   AST 16 11/30/2021   ALKPHOS 44 11/04/2015   BILITOT 0.9 11/30/2021   Lab Results  Component Value Date   CHOL 235 (H) 11/30/2021   Lab Results  Component Value Date   HDL 55 11/30/2021   Lab Results  Component Value Date   LDLCALC 151 (H) 11/30/2021   Lab Results  Component Value Date   TRIG 158 (H) 11/30/2021   Lab Results  Component Value Date   CHOLHDL 4.3 11/30/2021   IMPRESSION AND PLAN:   #1 health maintenance  exam: Reviewed age and gender appropriate health maintenance issues (prudent diet, regular exercise, health risks of tobacco and excessive alcohol, use of seatbelts, fire alarms in home, use of sunscreen).  Also reviewed age and gender appropriate health screening as well as vaccine recommendations. Vaccines: Flu-> today. Labs: Fasting health panel Prostate ca screening: average risk patient= as per latest guidelines, start screening at 65 yrs of age. Colon ca screening: average risk patient= as per latest guidelines, start screening at 42 yrs of age.  #2 hypertension, mild. Through shared decision-making process today we decided to hold off on medication.  He wants to reinstate a good exercise habits over the next 3 to 4 months and make a few dietary changes to try to match DASH diet. There is some question as to the calibration of his home blood pressure cuff so he plans on getting a new one.   #3 insomnia, has done well long-term on clonidine 0.2 mg nightly. Refilled today.  #4 recurrent genital herpes. He has been on 400 mg twice daily long-term. Doing well. Continue/refills given.  #5 erectile dysfunction. Psychogenic.  Discussed some stress reduction techniques, behavioral techniques, and gave reassurance that this will gradually resolve.  An After Visit Summary was printed and given to the patient.  FOLLOW UP:  Return for 3-4 mo f/u BPs.  Signed:  Santiago Bumpers, MD           12/20/2022

## 2022-12-20 ENCOUNTER — Encounter: Payer: Self-pay | Admitting: Family Medicine

## 2022-12-20 ENCOUNTER — Ambulatory Visit: Payer: No Typology Code available for payment source | Admitting: Family Medicine

## 2022-12-20 VITALS — BP 134/86 | HR 81 | Wt 184.2 lb

## 2022-12-20 DIAGNOSIS — I10 Essential (primary) hypertension: Secondary | ICD-10-CM

## 2022-12-20 DIAGNOSIS — F5101 Primary insomnia: Secondary | ICD-10-CM | POA: Diagnosis not present

## 2022-12-20 DIAGNOSIS — Z0000 Encounter for general adult medical examination without abnormal findings: Secondary | ICD-10-CM | POA: Diagnosis not present

## 2022-12-20 DIAGNOSIS — N528 Other male erectile dysfunction: Secondary | ICD-10-CM

## 2022-12-20 DIAGNOSIS — Z23 Encounter for immunization: Secondary | ICD-10-CM

## 2022-12-20 LAB — COMPREHENSIVE METABOLIC PANEL
ALT: 24 U/L (ref 0–53)
AST: 20 U/L (ref 0–37)
Albumin: 5.1 g/dL (ref 3.5–5.2)
Alkaline Phosphatase: 47 U/L (ref 39–117)
BUN: 12 mg/dL (ref 6–23)
CO2: 27 meq/L (ref 19–32)
Calcium: 9.9 mg/dL (ref 8.4–10.5)
Chloride: 101 meq/L (ref 96–112)
Creatinine, Ser: 1.03 mg/dL (ref 0.40–1.50)
GFR: 90.49 mL/min (ref >60.00)
Glucose, Bld: 77 mg/dL (ref 70–99)
Potassium: 4.1 meq/L (ref 3.5–5.1)
Sodium: 139 meq/L (ref 135–145)
Total Bilirubin: 1 mg/dL (ref 0.2–1.2)
Total Protein: 7.4 g/dL (ref 6.0–8.3)

## 2022-12-20 LAB — CBC WITH DIFFERENTIAL/PLATELET
Basophils Absolute: 0.1 10*3/uL (ref 0.0–0.1)
Basophils Relative: 1 % (ref 0.0–3.0)
Eosinophils Absolute: 0.2 10*3/uL (ref 0.0–0.7)
Eosinophils Relative: 3.7 % (ref 0.0–5.0)
HCT: 51.1 % (ref 39.0–52.0)
Hemoglobin: 16.9 g/dL (ref 13.0–17.0)
Lymphocytes Relative: 24.4 % (ref 12.0–46.0)
Lymphs Abs: 1.5 10*3/uL (ref 0.7–4.0)
MCHC: 33.2 g/dL (ref 30.0–36.0)
MCV: 94.3 fL (ref 78.0–100.0)
Monocytes Absolute: 0.4 10*3/uL (ref 0.1–1.0)
Monocytes Relative: 6 % (ref 3.0–12.0)
Neutro Abs: 3.9 10*3/uL (ref 1.4–7.7)
Neutrophils Relative %: 64.9 % (ref 43.0–77.0)
Platelets: 238 10*3/uL (ref 150.0–400.0)
RBC: 5.41 Mil/uL (ref 4.22–5.81)
RDW: 13.7 % (ref 11.5–15.5)
WBC: 6 10*3/uL (ref 4.0–10.5)

## 2022-12-20 LAB — LIPID PANEL
Cholesterol: 280 mg/dL — ABNORMAL HIGH (ref 0–200)
HDL: 47.3 mg/dL (ref >39.00)
LDL Cholesterol: 206 mg/dL — ABNORMAL HIGH (ref 0–99)
NonHDL: 233.06
Total CHOL/HDL Ratio: 6
Triglycerides: 133 mg/dL (ref 0.0–149.0)
VLDL: 26.6 mg/dL (ref 0.0–40.0)

## 2022-12-20 LAB — TSH: TSH: 1.84 u[IU]/mL (ref 0.35–5.50)

## 2022-12-20 MED ORDER — ACYCLOVIR 400 MG PO TABS: 400.0000 mg | ORAL_TABLET | Freq: Two times a day (BID) | ORAL | 3 refills | Status: DC

## 2022-12-20 MED ORDER — CLONIDINE HCL 0.1 MG PO TABS: 0.2000 mg | ORAL_TABLET | Freq: Every day | ORAL | 3 refills | Status: DC

## 2022-12-21 ENCOUNTER — Encounter: Payer: Self-pay | Admitting: Family Medicine

## 2023-01-04 ENCOUNTER — Encounter: Payer: Self-pay | Admitting: Family Medicine

## 2023-03-22 ENCOUNTER — Ambulatory Visit: Payer: No Typology Code available for payment source | Admitting: Family Medicine

## 2023-05-02 NOTE — Patient Instructions (Signed)

## 2023-05-04 ENCOUNTER — Encounter: Payer: Self-pay | Admitting: Family Medicine

## 2023-05-04 ENCOUNTER — Ambulatory Visit (INDEPENDENT_AMBULATORY_CARE_PROVIDER_SITE_OTHER): Payer: No Typology Code available for payment source | Admitting: Family Medicine

## 2023-05-04 VITALS — BP 118/79 | HR 93 | Ht 67.0 in | Wt 183.2 lb

## 2023-05-04 DIAGNOSIS — E7800 Pure hypercholesterolemia, unspecified: Secondary | ICD-10-CM | POA: Diagnosis not present

## 2023-05-04 DIAGNOSIS — Z9189 Other specified personal risk factors, not elsewhere classified: Secondary | ICD-10-CM

## 2023-05-04 DIAGNOSIS — J4540 Moderate persistent asthma, uncomplicated: Secondary | ICD-10-CM

## 2023-05-04 DIAGNOSIS — I10 Essential (primary) hypertension: Secondary | ICD-10-CM | POA: Diagnosis not present

## 2023-05-04 DIAGNOSIS — J309 Allergic rhinitis, unspecified: Secondary | ICD-10-CM | POA: Diagnosis not present

## 2023-05-04 LAB — LIPID PANEL
Cholesterol: 211 mg/dL — ABNORMAL HIGH (ref 0–200)
HDL: 48.6 mg/dL (ref >39.00)
LDL Cholesterol: 135 mg/dL — ABNORMAL HIGH (ref 0–99)
NonHDL: 162.32
Total CHOL/HDL Ratio: 4
Triglycerides: 137 mg/dL (ref 0.0–149.0)
VLDL: 27.4 mg/dL (ref 0.0–40.0)

## 2023-05-04 MED ORDER — LORATADINE 10 MG PO TABS: 10.0000 mg | ORAL_TABLET | Freq: Every day | ORAL | 3 refills | Status: DC

## 2023-05-04 MED ORDER — IPRATROPIUM BROMIDE 0.06 % NA SOLN: 2.0000 | Freq: Three times a day (TID) | NASAL | 5 refills | Status: AC | PRN

## 2023-05-04 NOTE — Progress Notes (Signed)
 OFFICE VISIT  05/04/2023  CC:  Chief Complaint  Patient presents with   Medical Management of Chronic Issues    3-4 mo f/u BP, pt is fasting    Patient is a 43 y.o. male who presents for 63-month follow-up hypertension and hypercholesterolemia. A/P as of last visit: "hypertension, mild. Through shared decision-making process today we decided to hold off on medication.  He wants to reinstate a good exercise habits over the next 3 to 4 months and make a few dietary changes to try to match DASH diet. There is some question as to the calibration of his home blood pressure cuff so he plans on getting a new one.    #3 insomnia, has done well long-term on clonidine 0.2 mg nightly. Refilled today.   #4 recurrent genital herpes. He has been on 400 mg twice daily long-term. Doing well. Continue/refills given.   #5 erectile dysfunction. Psychogenic.  Discussed some stress reduction techniques, behavioral techniques, and gave reassurance that this will gradually resolve."  INTERIM HX: Matthew Petty is doing well other than dealing with bad allergic rhinitis. He has multiple allergies, has been followed by allergy clinic in the past.  He has not been able to arrange his schedule in any way that would make going to their office for allergy shots doable. He requests prescription for Claritin.  Says Xyzal and other nonsedating antihistamines of actually cause sedation or have not worked for him.  LDL was 206 in November 2024 and I recommended he start statin at that time.  He chose to hold off. We discussed his high cholesterol today.  He says he has not changed diet or exercise any since last check.  Checked blood pressure infrequently since I last saw him but says it has been normal.  ROS as above, plus--> no fevers, no CP, no SOB, no wheezing, no cough, no dizziness, no HAs, no rashes, no melena/hematochezia.  No polyuria or polydipsia.  No myalgias or arthralgias.  No focal weakness, paresthesias, or  tremors.  No acute vision or hearing abnormalities.  No dysuria or unusual/new urinary urgency or frequency.  No recent changes in lower legs. No n/v/d or abd pain.  No palpitations.     Past Medical History:  Diagnosis Date   Allergic rhinitis    ragweed   Anxiety    Failed trial of wellbutrin in the past   Collapsed lung 05/2012   Collapsed RML in the midst of hospitalization for asthma exacerbation with RML pneumonia (pt to f/u with Dr. Marchelle Gearing)   Cyst of paranasal sinus 02/2015   Detected by dental imaging; pt refered to surgeon by his dentist per his report 02/2015   Elevated blood pressure reading without diagnosis of hypertension    Herpes simplex    genital, recurrent   Hypercholesterolemia    LDL>200 Nov 2024   Insomnia 10/21/2010   Moderate persistent asthma    Oral allergy syndrome 2019   oral allergy syndrome (OAS) or pollen-food allergy syndrome (PFAS)-->allergist-->food allergy testing NEG.     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 No pertinent surgical history.  Outpatient Medications Prior to Visit  Medication Sig Dispense Refill   acyclovir (ZOVIRAX) 400 MG tablet Take 1 tablet (400 mg total) by mouth 2 (two) times daily. 180 tablet 3   albuterol (VENTOLIN HFA) 108 (90 Base) MCG/ACT inhaler Inhale 2 puffs into the lungs  every 4 (four) hours as needed for wheezing or shortness of breath. 18 g 1   Azelastine-Fluticasone 137-50 MCG/ACT SUSP Place 1 spray into both nostrils 2 (two) times daily. 23 g 11   cloNIDine (CATAPRES) 0.1 MG tablet Take 2 tablets (0.2 mg total) by mouth at bedtime. 180 tablet 3   fluticasone (FLONASE) 50 MCG/ACT nasal spray Place 2 sprays into both nostrils daily. 16 g 5   ibuprofen (ADVIL,MOTRIN) 200 MG tablet Take 200-400 mg by mouth every 6 (six) hours as needed for pain, fever or headache.     Spacer/Aero-Holding Chambers DEVI 1 Device by Does not apply route  daily. 1 each 1   loratadine (CLARITIN) 10 MG tablet Take 1 tablet (10 mg total) by mouth daily. 30 tablet 5   fluticasone-salmeterol (WIXELA INHUB) 250-50 MCG/ACT AEPB With respiratory illness or flare ups, use Wixela 250-47mcg 2 puffs twice daily for 1-2 weeks. (Patient not taking: Reported on 05/04/2023) 60 each 3   ipratropium (ATROVENT) 0.06 % nasal spray Place 2 sprays into both nostrils 3 (three) times daily as needed for rhinitis. (Patient not taking: Reported on 05/04/2023) 15 mL 5   levocetirizine (XYZAL) 5 MG tablet Take 1 tablet (5 mg total) by mouth every evening. (Patient not taking: Reported on 05/04/2023) 30 tablet 5   No facility-administered medications prior to visit.    No Known Allergies  Review of Systems As per HPI  PE:    05/04/2023    9:48 AM 05/04/2023    9:47 AM 12/20/2022   10:45 AM  Vitals with BMI  Height  5\' 7"    Weight  183 lbs 3 oz   BMI  28.69   Systolic 118  134  Diastolic 79  86  Pulse 93       Physical Exam  Gen: Alert, well appearing.  Patient is oriented to person, place, time, and situation. AFFECT: pleasant, lucid thought and speech. CV: RRR, no m/r/g.   LUNGS: CTA bilat, nonlabored resps, good aeration in all lung fields.  No further exam today.  LABS:  Last CBC Lab Results  Component Value Date   WBC 6.0 12/20/2022   HGB 16.9 12/20/2022   HCT 51.1 12/20/2022   MCV 94.3 12/20/2022   MCH 31.7 11/30/2021   RDW 13.7 12/20/2022   PLT 238.0 12/20/2022   Last metabolic panel Lab Results  Component Value Date   GLUCOSE 77 12/20/2022   NA 139 12/20/2022   K 4.1 12/20/2022   CL 101 12/20/2022   CO2 27 12/20/2022   BUN 12 12/20/2022   CREATININE 1.03 12/20/2022   GFR 90.49 12/20/2022   CALCIUM 9.9 12/20/2022   PROT 7.4 12/20/2022   ALBUMIN 5.1 12/20/2022   BILITOT 1.0 12/20/2022   ALKPHOS 47 12/20/2022   AST 20 12/20/2022   ALT 24 12/20/2022   Last lipids Lab Results  Component Value Date   CHOL 280 (H) 12/20/2022   HDL  47.30 12/20/2022   LDLCALC 206 (H) 12/20/2022   TRIG 133.0 12/20/2022   CHOLHDL 6 12/20/2022   Last thyroid functions Lab Results  Component Value Date   TSH 1.84 12/20/2022   IMPRESSION AND PLAN:  #1 hypercholesterolemia. Discussed guidelines of starting statin for LDL greater than 190. However, through shared decision-making process today we decided to check coronary calcium score and use this information to further help our decision regarding statin. Recheck lipids and check lipoprotein a level today.  2.  Hypertension, home blood pressure and blood pressures here  in the clinic today normal. Will continue periodic measurement and continue off medication at this time.  3.  Allergic rhinitis and asthma. Asthma is well-controlled although he admits he needs to be more compliant with use of his Advair. Will see if we can find a allergy clinic that is open to him giving himself allergy shots at home. He has tried various over-the-counter nonsedating antihistamines and all of them except Claritin either have not worked or have made him too drowsy.  Claritin prescription today.  Continue azelastine-fluticasone nasal spray regularly and refilled his Atrovent nasal spray to use as needed.  An After Visit Summary was printed and given to the patient.  FOLLOW UP: Return in about 6 months (around 11/03/2023) for annual CPE (fasting). Next CPE November 2025 Signed:  Santiago Bumpers, MD           05/04/2023

## 2023-05-05 ENCOUNTER — Encounter: Payer: Self-pay | Admitting: Family Medicine

## 2023-05-06 LAB — LIPOPROTEIN A (LPA): Lipoprotein (a): 17 nmol/L (ref <75)

## 2023-05-25 ENCOUNTER — Ambulatory Visit (HOSPITAL_COMMUNITY)
Admission: RE | Admit: 2023-05-25 | Discharge: 2023-05-25 | Disposition: A | Discharge status: Home / Self Care | Payer: Self-pay | Source: Ambulatory Visit | Attending: Family Medicine | Admitting: Family Medicine

## 2023-05-25 DIAGNOSIS — Z9189 Other specified personal risk factors, not elsewhere classified: Secondary | ICD-10-CM | POA: Insufficient documentation

## 2023-05-26 ENCOUNTER — Encounter: Payer: Self-pay | Admitting: Family Medicine

## 2023-06-07 ENCOUNTER — Encounter: Payer: Self-pay | Admitting: Family Medicine

## 2023-10-24 ENCOUNTER — Other Ambulatory Visit: Payer: Self-pay | Admitting: Internal Medicine

## 2024-01-04 ENCOUNTER — Telehealth: Payer: Self-pay | Admitting: Family Medicine

## 2024-01-04 NOTE — Telephone Encounter (Signed)
 Copied from CRM #[id number]. Topic: Clinical - Medication Refill >> Jan 04, 2024 10:10 AM Aleatha C wrote: Medication: acyclovir  (ZOVIRAX ) 400 MG tablet and cloNIDine  (CATAPRES ) 0.1 MG tablet  Has the patient contacted their pharmacy? No (Agent: If no, request that the patient contact the pharmacy for the refill. If patient does not wish to contact the pharmacy document the reason why and proceed with request.) (Agent: If yes, when and what did the pharmacy advise?)  This is the patient's preferred pharmacy:  [facility]  [address] [place], [place], [place] [address] Open  Closes 1:30 PM  Reopens 2 PM  More hours [phone]   Is this the correct pharmacy for this prescription? Yes If no, delete pharmacy and type the correct one.   Has the prescription been filled recently? No  Is the patient out of the medication? Yes  Has the patient been seen for an appointment in the last year OR does the patient have an upcoming appointment? Yes  Can we respond through MyChart? No  Agent: Please be advised that Rx refills may take up to 3 business days. We ask that you follow-up with your pharmacy.

## 2024-01-05 MED ORDER — CLONIDINE HCL 0.1 MG PO TABS: 0.2000 mg | ORAL_TABLET | Freq: Every day | ORAL | 0 refills | Status: DC

## 2024-01-05 MED ORDER — ACYCLOVIR 400 MG PO TABS: 400.0000 mg | ORAL_TABLET | Freq: Two times a day (BID) | ORAL | 0 refills | Status: DC

## 2024-01-12 ENCOUNTER — Other Ambulatory Visit: Payer: Self-pay | Admitting: Family Medicine

## 2024-01-20 ENCOUNTER — Encounter: Payer: Self-pay | Admitting: Family Medicine

## 2024-01-20 ENCOUNTER — Ambulatory Visit: Admitting: Family Medicine

## 2024-01-20 VITALS — BP 124/79 | HR 88 | Temp 98.3°F | Ht 68.5 in | Wt 187.4 lb

## 2024-01-20 DIAGNOSIS — Z0000 Encounter for general adult medical examination without abnormal findings: Secondary | ICD-10-CM

## 2024-01-20 DIAGNOSIS — E7800 Pure hypercholesterolemia, unspecified: Secondary | ICD-10-CM

## 2024-01-20 DIAGNOSIS — R030 Elevated blood-pressure reading, without diagnosis of hypertension: Secondary | ICD-10-CM | POA: Diagnosis not present

## 2024-01-20 MED ORDER — ACYCLOVIR 400 MG PO TABS: 400.0000 mg | ORAL_TABLET | Freq: Two times a day (BID) | ORAL | 3 refills | Status: AC

## 2024-01-20 MED ORDER — LEVOCETIRIZINE DIHYDROCHLORIDE 5 MG PO TABS: 5.0000 mg | ORAL_TABLET | Freq: Every evening | ORAL | 3 refills | Status: AC

## 2024-01-20 MED ORDER — CLONIDINE HCL 0.1 MG PO TABS: 0.2000 mg | ORAL_TABLET | Freq: Every day | ORAL | 3 refills | Status: AC

## 2024-01-20 NOTE — Patient Instructions (Signed)
 Health Maintenance, Male  Adopting a healthy lifestyle and getting preventive care are important in promoting health and wellness. Ask your health care provider about:  The right schedule for you to have regular tests and exams.  Things you can do on your own to prevent diseases and keep yourself healthy.  What should I know about diet, weight, and exercise?  Eat a healthy diet    Eat a diet that includes plenty of vegetables, fruits, low-fat dairy products, and lean protein.  Do not eat a lot of foods that are high in solid fats, added sugars, or sodium.  Maintain a healthy weight  Body mass index (BMI) is a measurement that can be used to identify possible weight problems. It estimates body fat based on height and weight. Your health care provider can help determine your BMI and help you achieve or maintain a healthy weight.  Get regular exercise  Get regular exercise. This is one of the most important things you can do for your health. Most adults should:  Exercise for at least 150 minutes each week. The exercise should increase your heart rate and make you sweat (moderate-intensity exercise).  Do strengthening exercises at least twice a week. This is in addition to the moderate-intensity exercise.  Spend less time sitting. Even light physical activity can be beneficial.  Watch cholesterol and blood lipids  Have your blood tested for lipids and cholesterol at 42 years of age, then have this test every 5 years.  You may need to have your cholesterol levels checked more often if:  Your lipid or cholesterol levels are high.  You are older than 42 years of age.  You are at high risk for heart disease.  What should I know about cancer screening?  Many types of cancers can be detected early and may often be prevented. Depending on your health history and family history, you may need to have cancer screening at various ages. This may include screening for:  Colorectal cancer.  Prostate cancer.  Skin cancer.  Lung  cancer.  What should I know about heart disease, diabetes, and high blood pressure?  Blood pressure and heart disease  High blood pressure causes heart disease and increases the risk of stroke. This is more likely to develop in people who have high blood pressure readings or are overweight.  Talk with your health care provider about your target blood pressure readings.  Have your blood pressure checked:  Every 3-5 years if you are 24-52 years of age.  Every year if you are 3 years old or older.  If you are between the ages of 60 and 72 and are a current or former smoker, ask your health care provider if you should have a one-time screening for abdominal aortic aneurysm (AAA).  Diabetes  Have regular diabetes screenings. This checks your fasting blood sugar level. Have the screening done:  Once every three years after age 66 if you are at a normal weight and have a low risk for diabetes.  More often and at a younger age if you are overweight or have a high risk for diabetes.  What should I know about preventing infection?  Hepatitis B  If you have a higher risk for hepatitis B, you should be screened for this virus. Talk with your health care provider to find out if you are at risk for hepatitis B infection.  Hepatitis C  Blood testing is recommended for:  Everyone born from [birth year] through 1964.  Anyone  with known risk factors for hepatitis C.  Sexually transmitted infections (STIs)  You should be screened each year for STIs, including gonorrhea and chlamydia, if:  You are sexually active and are younger than 42 years of age.  You are older than 42 years of age and your health care provider tells you that you are at risk for this type of infection.  Your sexual activity has changed since you were last screened, and you are at increased risk for chlamydia or gonorrhea. Ask your health care provider if you are at risk.  Ask your health care provider about whether you are at high risk for HIV. Your health care provider  may recommend a prescription medicine to help prevent HIV infection. If you choose to take medicine to prevent HIV, you should first get tested for HIV. You should then be tested every 3 months for as long as you are taking the medicine.  Follow these instructions at home:  Alcohol use  Do not drink alcohol if your health care provider tells you not to drink.  If you drink alcohol:  Limit how much you have to 0-2 drinks a day.  Know how much alcohol is in your drink. In the [place] one drink equals one 12 oz bottle of beer (355 mL), one 5 oz glass of wine (148 mL), or one 1 oz glass of hard liquor (44 mL).  Lifestyle  Do not use any products that contain nicotine or tobacco. These products include cigarettes, chewing tobacco, and vaping devices, such as e-cigarettes. If you need help quitting, ask your health care provider.  Do not use street drugs.  Do not share needles.  Ask your health care provider for help if you need support or information about quitting drugs.  General instructions  Schedule regular health, dental, and eye exams.  Stay current with your vaccines.  Tell your health care provider if:  You often feel depressed.  You have ever been abused or do not feel safe at home.  Summary  Adopting a healthy lifestyle and getting preventive care are important in promoting health and wellness.  Follow your health care provider's instructions about healthy diet, exercising, and getting tested or screened for diseases.  Follow your health care provider's instructions on monitoring your cholesterol and blood pressure.  This information is not intended to replace advice given to you by your health care provider. Make sure you discuss any questions you have with your health care provider.  Document Revised: 06/09/2020 Document Reviewed: 06/09/2020  Elsevier Patient Education  2024 [employer].

## 2024-01-20 NOTE — Progress Notes (Signed)
 "     Office Note 01/20/2024  CC: No chief complaint on file.  Patient is a 43 y.o. male who is here for annual health maintenance exam. A/P as of last visit: #1 hypercholesterolemia. Discussed guidelines of starting statin for LDL greater than 190. However, through shared decision-making process today we decided to check coronary calcium score and use this information to further help our decision regarding statin. Recheck lipids and check lipoprotein a level today.   2.  Hypertension, home blood pressure and blood pressures here in the clinic today normal. Will continue periodic measurement and continue off medication at this time.   3.  Allergic rhinitis and asthma. Asthma is well-controlled although he admits he needs to be more compliant with use of his Advair. Will see if we can find a allergy  clinic that is open to him giving himself allergy  shots at home. He has tried various over-the-counter nonsedating antihistamines and all of them except Claritin  either have not worked or have made him too drowsy.  Claritin  prescription today. Continue azelastine -fluticasone  nasal spray regularly and refilled his Atrovent  nasal spray to use as needed.  INTERIM HX: Harald is doing well.  He has not had to use his Wixela inhaler any the last several months. He is on Xyzal  for his allergies and says this medication has made a great difference in his allergies as well as his asthma.  He takes clonidine  2 tabs nightly for sleep and this helps well.   Past Medical History:  Diagnosis Date   Allergic rhinitis    ragweed   Anxiety    Failed trial of wellbutrin in the past   Collapsed lung 05/2012   Collapsed RML in the midst of hospitalization for asthma exacerbation with RML pneumonia (pt to f/u with Dr. Geronimo)   Cyst of paranasal sinus 02/2015   Detected by dental imaging; pt refered to surgeon by his dentist per his report 02/2015   Elevated blood pressure reading without diagnosis  of hypertension    Herpes simplex    genital, recurrent   Hypercholesterolemia    LDL>200 Nov 2024   Insomnia 10/21/2010   Moderate persistent asthma    Oral allergy  syndrome 2019   oral allergy  syndrome (OAS) or pollen-food allergy  syndrome (PFAS)-->allergist-->food allergy  testing NEG.     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    Social History   Socioeconomic History   Marital status: Married    Spouse name: Burnard   Number of children: Not on file   Years of education: Not on file   Highest education level: Not on file  Occupational History    Employer: [employer]  Tobacco Use   Smoking status: Never    Passive exposure: Never   Smokeless tobacco: Never  Vaping Use   Vaping status: Never Used  Substance and Sexual Activity   Alcohol use: Yes    Comment: weekend  Drug use: Never   Sexual activity: Yes  Other Topics Concern   Not on file  Social History Narrative   Married, has two children.   Moved to [place]  from [place] around 2006.   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   Has postgrad degree in business/finance.   No exercise.   No tobacco or drugs.  Drinks some alcohol on weekends/socially.   Social Drivers of Health   Tobacco Use: Low Risk (01/20/2024)   Patient History    Smoking Tobacco Use: Never    Smokeless Tobacco Use: Never    Passive Exposure: Never  Financial Resource Strain: Not on file  Food Insecurity: Not on file  Transportation Needs: Not on file  Physical Activity: Not on file  Stress: Not on  file  Social Connections: Not on file  Intimate Partner Violence: Not on file  Depression (PHQ2-9): Low Risk (01/20/2024)   Depression (PHQ2-9)    PHQ-2 Score: 2  Alcohol Screen: Not on file  Housing: Not on file  Utilities: Not on file  Health Literacy: Not on file    Outpatient Medications Prior to Visit  Medication Sig Dispense Refill   albuterol  (VENTOLIN  HFA) 108 (90 Base) MCG/ACT inhaler Inhale 2 puffs into the lungs every 4 (four) hours as needed for wheezing or shortness of breath. 18 g 1   Azelastine -Fluticasone  137-50 MCG/ACT SUSP Place 1 spray into both nostrils 2 (two) times daily. 23 g 11   ibuprofen  (ADVIL ,MOTRIN ) 200 MG tablet Take 200-400 mg by mouth every 6 (six) hours as needed for pain, fever or headache.     ipratropium (ATROVENT ) 0.06 % nasal spray Place 2 sprays into both nostrils 3 (three) times daily as needed for rhinitis. 15 mL 5   Spacer/Aero-Holding Chambers DEVI 1 Device by Does not apply route daily. 1 each 1   levocetirizine (XYZAL ) 5 MG tablet TAKE 1 TABLET BY MOUTH EVERY DAY IN THE EVENING 90 tablet 0   fluticasone -salmeterol (WIXELA INHUB) 250-50 MCG/ACT AEPB With respiratory illness or flare ups, use Wixela 250-50mcg 2 puffs twice daily for 1-2 weeks. (Patient not taking: Reported on 01/20/2024) 60 each 3   acyclovir  (ZOVIRAX ) 400 MG tablet Take 1 tablet (400 mg total) by mouth 2 (two) times daily. 30 tablet 0   cloNIDine  (CATAPRES ) 0.1 MG tablet Take 2 tablets (0.2 mg total) by mouth at bedtime. 60 tablet 0   fluticasone  (FLONASE ) 50 MCG/ACT nasal spray Place 2 sprays into both nostrils daily. (Patient not taking: Reported on 01/20/2024) 16 g 5   loratadine  (CLARITIN ) 10 MG tablet Take 1 tablet (10 mg total) by mouth daily. 90 tablet 3   No facility-administered medications prior to visit.    Allergies[1]  Review of Systems  Constitutional:  Negative for appetite change, chills, fatigue and fever.  HENT:  Negative for congestion, dental problem,  ear pain and sore throat.   Eyes:  Negative for discharge, redness and visual disturbance.  Respiratory:  Negative for cough, chest tightness, shortness of breath and wheezing.   Cardiovascular:  Negative for chest pain, palpitations and leg swelling.  Gastrointestinal:  Negative for abdominal pain, blood in stool, diarrhea, nausea and vomiting.  Genitourinary:  Negative for difficulty urinating, dysuria, flank pain, frequency, hematuria and urgency.  Musculoskeletal:  Negative for arthralgias, back pain, joint swelling, myalgias and neck stiffness.  Skin:  Negative for pallor and rash.  Neurological:  Negative for dizziness, speech difficulty, weakness and headaches.  Hematological:  Negative for adenopathy. Does not bruise/bleed  easily.  Psychiatric/Behavioral:  Negative for confusion and sleep disturbance. The patient is not nervous/anxious.     PE;    01/20/2024    3:36 PM 05/04/2023    9:48 AM 05/04/2023    9:47 AM  Vitals with BMI  Height 5' 8.5  5' 7  Weight 187 lbs 6 oz  183 lbs 3 oz  BMI 28.08  28.69  Systolic 124 118   Diastolic 79 79   Pulse 88 93   Gen: Alert, well appearing.  Patient is oriented to person, place, time, and situation. AFFECT: pleasant, lucid thought and speech. ENT: Ears: EACs clear, normal epithelium.  TMs with good light reflex and landmarks bilaterally.  Eyes: no injection, icteris, swelling, or exudate.  EOMI, PERRLA. Nose: no drainage or turbinate edema/swelling.  No injection or focal lesion.  Mouth: lips without lesion/swelling.  Oral mucosa pink and moist.  Dentition intact and without obvious caries or gingival swelling.  Oropharynx without erythema, exudate, or swelling.  Neck: supple/nontender.  No LAD, mass, or TM.  Carotid pulses 2+ bilaterally, without bruits. CV: RRR, no m/r/g.   LUNGS: CTA bilat, nonlabored resps, good aeration in all lung fields. ABD: soft, NT, ND, BS normal.  No hepatospenomegaly or mass.  No bruits. EXT: no clubbing,  cyanosis, or edema.  Musculoskeletal: no joint swelling, erythema, warmth, or tenderness.  ROM of all joints intact. Skin - no sores or suspicious lesions or rashes or color changes  Pertinent labs:  Lab Results  Component Value Date   TSH 1.84 12/20/2022   Lab Results  Component Value Date   WBC 6.0 12/20/2022   HGB 16.9 12/20/2022   HCT 51.1 12/20/2022   MCV 94.3 12/20/2022   PLT 238.0 12/20/2022   Lab Results  Component Value Date   CREATININE 1.03 12/20/2022   BUN 12 12/20/2022   NA 139 12/20/2022   K 4.1 12/20/2022   CL 101 12/20/2022   CO2 27 12/20/2022   Lab Results  Component Value Date   ALT 24 12/20/2022   AST 20 12/20/2022   ALKPHOS 47 12/20/2022   BILITOT 1.0 12/20/2022   Lab Results  Component Value Date   CHOL 211 (H) 05/04/2023   Lab Results  Component Value Date   HDL 48.60 05/04/2023   Lab Results  Component Value Date   LDLCALC 135 (H) 05/04/2023   Lab Results  Component Value Date   TRIG 137.0 05/04/2023   Lab Results  Component Value Date   CHOLHDL 4 05/04/2023    ASSESSMENT AND PLAN:    #1 health maintenance exam: Reviewed age and gender appropriate health maintenance issues (prudent diet, regular exercise, health risks of tobacco and excessive alcohol, use of seatbelts, fire alarms in home, use of sunscreen).  Also reviewed age and gender appropriate health screening as well as vaccine recommendations. Vaccines: All up-to-date Labs: Nonfasting health panel obtained today. Prostate ca screening: average risk patient= as per latest guidelines, start screening at 30 yrs of age. Colon ca screening: average risk patient= as per latest guidelines, start screening at 61 yrs of age.  #2 hypercholesterolemia. His LDL has ranged 135-206 in the past, most recently 135 on 05/04/2023. Coronary calcium score 0 approximately 7 months ago. Monitor lipid panel today (he is not fasting).  An After Visit Summary was printed and given to the  patient.  FOLLOW UP:  Return in about 6 months (around 07/20/2024) for routine chronic illness f/u.  Signed:  Phil Deena Shaub, MD  01/20/2024     [1] No Known Allergies  "

## 2024-01-21 LAB — CBC WITH DIFFERENTIAL/PLATELET
Absolute Lymphocytes: 2192 {cells}/uL (ref 850–3900)
Absolute Monocytes: 528 {cells}/uL (ref 200–950)
Basophils Absolute: 72 {cells}/uL (ref 0–200)
Basophils Relative: 0.9 %
Eosinophils Absolute: 432 {cells}/uL (ref 15–500)
Eosinophils Relative: 5.4 %
HCT: 47.3 % (ref 39.4–51.1)
Hemoglobin: 16.2 g/dL (ref 13.2–17.1)
MCH: 31.5 pg (ref 27.0–33.0)
MCHC: 34.2 g/dL (ref 31.6–35.4)
MCV: 91.8 fL (ref 81.4–101.7)
MPV: 11.3 fL (ref 7.5–12.5)
Monocytes Relative: 6.6 %
Neutro Abs: 4776 {cells}/uL (ref 1500–7800)
Neutrophils Relative %: 59.7 %
Platelets: 268 Thousand/uL (ref 140–400)
RBC: 5.15 Million/uL (ref 4.20–5.80)
RDW: 12.8 % (ref 11.0–15.0)
Total Lymphocyte: 27.4 %
WBC: 8 Thousand/uL (ref 3.8–10.8)

## 2024-01-21 LAB — COMPREHENSIVE METABOLIC PANEL WITH GFR
AG Ratio: 2 (calc) (ref 1.0–2.5)
ALT: 27 U/L (ref 9–46)
AST: 19 U/L (ref 10–40)
Albumin: 4.7 g/dL (ref 3.6–5.1)
Alkaline phosphatase (APISO): 45 U/L (ref 36–130)
BUN: 13 mg/dL (ref 7–25)
CO2: 24 mmol/L (ref 20–32)
Calcium: 9.5 mg/dL (ref 8.6–10.3)
Chloride: 104 mmol/L (ref 98–110)
Creat: 1.19 mg/dL (ref 0.60–1.29)
Globulin: 2.4 g/dL (ref 1.9–3.7)
Glucose, Bld: 86 mg/dL (ref 65–99)
Potassium: 3.9 mmol/L (ref 3.5–5.3)
Sodium: 137 mmol/L (ref 135–146)
Total Bilirubin: 0.8 mg/dL (ref 0.2–1.2)
Total Protein: 7.1 g/dL (ref 6.1–8.1)
eGFR: 78 mL/min/1.73m2

## 2024-01-21 LAB — LIPID PANEL
Cholesterol: 255 mg/dL — ABNORMAL HIGH
HDL: 43 mg/dL
LDL Cholesterol (Calc): 164 mg/dL — ABNORMAL HIGH
Non-HDL Cholesterol (Calc): 212 mg/dL — ABNORMAL HIGH
Total CHOL/HDL Ratio: 5.9 (calc) — ABNORMAL HIGH
Triglycerides: 292 mg/dL — ABNORMAL HIGH

## 2024-01-21 LAB — TSH: TSH: 1.32 m[IU]/L (ref 0.40–4.50)

## 2024-01-23 ENCOUNTER — Ambulatory Visit: Payer: Self-pay | Admitting: Family Medicine
